# Patient Record
Sex: Female | Born: 1995
Health system: Southern US, Community
[De-identification: ages and names within clinical notes are randomized; demographics above are authoritative.]

## PROBLEM LIST (undated history)

## (undated) ENCOUNTER — Ambulatory Visit: Admission: EM | Payer: Medicaid Other | Source: Home / Self Care

## (undated) ENCOUNTER — Ambulatory Visit: Source: Home / Self Care

## (undated) DIAGNOSIS — E611 Iron deficiency: Secondary | ICD-10-CM

## (undated) DIAGNOSIS — L309 Dermatitis, unspecified: Secondary | ICD-10-CM

## (undated) HISTORY — DX: Dermatitis, unspecified: L30.9

---
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
## 2014-11-24 ENCOUNTER — Other Ambulatory Visit: Payer: Self-pay | Admitting: Family

## 2014-11-24 DIAGNOSIS — E049 Nontoxic goiter, unspecified: Secondary | ICD-10-CM

## 2014-12-01 ENCOUNTER — Other Ambulatory Visit: Payer: Self-pay

## 2015-11-29 ENCOUNTER — Other Ambulatory Visit: Payer: Self-pay

## 2015-11-29 ENCOUNTER — Other Ambulatory Visit (HOSPITAL_COMMUNITY)
Admission: RE | Admit: 2015-11-29 | Discharge: 2015-11-29 | Disposition: A | Discharge status: Home / Self Care | Payer: Self-pay | Source: Ambulatory Visit | Attending: Family | Admitting: Family

## 2015-11-29 ENCOUNTER — Other Ambulatory Visit (HOSPITAL_COMMUNITY): Admission: RE | Admit: 2015-11-29 | Payer: Self-pay | Source: Ambulatory Visit

## 2015-11-29 DIAGNOSIS — N760 Acute vaginitis: Secondary | ICD-10-CM | POA: Insufficient documentation

## 2015-11-29 DIAGNOSIS — Z1151 Encounter for screening for human papillomavirus (HPV): Secondary | ICD-10-CM | POA: Insufficient documentation

## 2015-11-29 DIAGNOSIS — Z01411 Encounter for gynecological examination (general) (routine) with abnormal findings: Secondary | ICD-10-CM | POA: Insufficient documentation

## 2015-11-30 LAB — CYTOLOGY - PAP

## 2015-12-29 ENCOUNTER — Ambulatory Visit (INDEPENDENT_AMBULATORY_CARE_PROVIDER_SITE_OTHER): Payer: BLUE CROSS/BLUE SHIELD | Admitting: Family Medicine

## 2015-12-29 VITALS — BP 104/62 | HR 106 | Temp 98.3°F | Resp 16 | Ht 64.0 in | Wt 137.4 lb

## 2015-12-29 DIAGNOSIS — N3001 Acute cystitis with hematuria: Secondary | ICD-10-CM | POA: Diagnosis not present

## 2015-12-29 DIAGNOSIS — R309 Painful micturition, unspecified: Secondary | ICD-10-CM | POA: Diagnosis not present

## 2015-12-29 LAB — POCT URINALYSIS DIP (MANUAL ENTRY)
Bilirubin, UA: NEGATIVE
Blood, UA: NEGATIVE
Glucose, UA: 100 — AB
Nitrite, UA: POSITIVE — AB
Protein Ur, POC: 30 — AB
Spec Grav, UA: <=1.005
Urobilinogen, UA: 1
pH, UA: 5

## 2015-12-29 LAB — POC MICROSCOPIC URINALYSIS (UMFC): Mucus: ABSENT

## 2015-12-29 MED ORDER — FLUCONAZOLE 150 MG PO TABS: 150.0000 mg | ORAL_TABLET | Freq: Once | ORAL | Status: DC

## 2015-12-29 MED ORDER — CEPHALEXIN 500 MG PO CAPS: 500.0000 mg | ORAL_CAPSULE | Freq: Three times a day (TID) | ORAL | Status: DC

## 2015-12-29 NOTE — Progress Notes (Signed)
Cassie ShadeLisa L Garrett is a 20 y.o. female who presents to Urgent Care today for urinary frequency urgency and dysuria. Symptoms present for the last 5 days. Symptoms are consistent with previous history of UTI. Patient also notes mild nausea. She notes mild pelvis pain and discomfort. She denies frank abdominal pain and vomiting or diarrhea. She has tried AZO which helps some. She feels well otherwise.   No past medical history on file. No past surgical history on file. Social History  Substance Use Topics  . Smoking status: Never Smoker   . Smokeless tobacco: Not on file  . Alcohol Use: Not on file   ROS as above Medications: Current Outpatient Prescriptions  Medication Sig Dispense Refill  . cephALEXin (KEFLEX) 500 MG capsule Take 1 capsule (500 mg total) by mouth 3 (three) times daily. 21 capsule 0  . fluconazole (DIFLUCAN) 150 MG tablet Take 1 tablet (150 mg total) by mouth once. For yeast infection 1 tablet 2   No current facility-administered medications for this visit.   No Known Allergies   Exam:  BP 104/62 mmHg  Pulse 106  Temp(Src) 98.3 F (36.8 C) (Oral)  Resp 16  Ht 5\' 4"  (1.626 m)  Wt 137 lb 6.4 oz (62.324 kg)  BMI 23.57 kg/m2  SpO2 95%  LMP 12/10/2015 Gen: Well NAD, Nontoxic appearing HEENT: EOMI,  MMM Lungs: Normal work of breathing. CTABL Heart: RRR no MRG Abd: NABS, Soft. Nondistended, mildly tender to palpation pelvis without rebound or guarding or masses palpated. No CVA angle tenderness to percussion Exts: Brisk capillary refill, warm and well perfused.   Results for orders placed or performed in visit on 12/29/15 (from the past 24 hour(s))  POCT urinalysis dipstick     Status: Abnormal   Collection Time: 12/29/15  6:35 PM  Result Value Ref Range   Color, UA orange (A) yellow   Clarity, UA cloudy (A) clear   Glucose, UA =100 (A) negative   Bilirubin, UA negative negative   Ketones, POC UA trace (5) (A) negative   Spec Grav, UA <=1.005    Blood, UA  negative negative   pH, UA 5.0    Protein Ur, POC =30 (A) negative   Urobilinogen, UA 1.0    Nitrite, UA Positive (A) Negative   Leukocytes, UA large (3+) (A) Negative   No results found.  Assessment and Plan: 20 y.o. female with Urinary tract infection. Culture pending. Empiric treatment with Keflex. Additionally use Diflucan for prevention of yeast infection. Return as needed.  Discussed warning signs or symptoms. Please see discharge instructions. Patient expresses understanding.

## 2015-12-29 NOTE — Patient Instructions (Addendum)
Thank you for coming in today. Take antibiotics.  Take fluconazole if you develop a yeast infection.  Return if not better.  If your belly pain worsens, or you have high fever, bad vomiting, blood in your stool or black tarry stool go to the Emergency Room.    Urinary Tract Infection Urinary tract infections (UTIs) can develop anywhere along your urinary tract. Your urinary tract is your body's drainage system for removing wastes and extra water. Your urinary tract includes two kidneys, two ureters, a bladder, and a urethra. Your kidneys are a pair of bean-shaped organs. Each kidney is about the size of your fist. They are located below your ribs, one on each side of your spine. CAUSES Infections are caused by microbes, which are microscopic organisms, including fungi, viruses, and bacteria. These organisms are so small that they can only be seen through a microscope. Bacteria are the microbes that most commonly cause UTIs. SYMPTOMS  Symptoms of UTIs may vary by age and gender of the patient and by the location of the infection. Symptoms in young women typically include a frequent and intense urge to urinate and a painful, burning feeling in the bladder or urethra during urination. Older women and men are more likely to be tired, shaky, and weak and have muscle aches and abdominal pain. A fever may mean the infection is in your kidneys. Other symptoms of a kidney infection include pain in your back or sides below the ribs, nausea, and vomiting. DIAGNOSIS To diagnose a UTI, your caregiver will ask you about your symptoms. Your caregiver will also ask you to provide a urine sample. The urine sample will be tested for bacteria and white blood cells. White blood cells are made by your body to help fight infection. TREATMENT  Typically, UTIs can be treated with medication. Because most UTIs are caused by a bacterial infection, they usually can be treated with the use of antibiotics. The choice of  antibiotic and length of treatment depend on your symptoms and the type of bacteria causing your infection. HOME CARE INSTRUCTIONS  If you were prescribed antibiotics, take them exactly as your caregiver instructs you. Finish the medication even if you feel better after you have only taken some of the medication.  Drink enough water and fluids to keep your urine clear or pale yellow.  Avoid caffeine, tea, and carbonated beverages. They tend to irritate your bladder.  Empty your bladder often. Avoid holding urine for long periods of time.  Empty your bladder before and after sexual intercourse.  After a bowel movement, women should cleanse from front to back. Use each tissue only once. SEEK MEDICAL CARE IF:   You have back pain.  You develop a fever.  Your symptoms do not begin to resolve within 3 days. SEEK IMMEDIATE MEDICAL CARE IF:   You have severe back pain or lower abdominal pain.  You develop chills.  You have nausea or vomiting.  You have continued burning or discomfort with urination. MAKE SURE YOU:   Understand these instructions.  Will watch your condition.  Will get help right away if you are not doing well or get worse.   This information is not intended to replace advice given to you by your health care provider. Make sure you discuss any questions you have with your health care provider.   Document Released: 06/05/2005 Document Revised: 05/17/2015 Document Reviewed: 10/04/2011 Elsevier Interactive Patient Education [employer]    IF you received an x-ray today,  you will receive an invoice from [facility]. Please contact [facility] at [phone] with questions or concerns regarding your invoice.   IF you received labwork today, you will receive an invoice from [facility]. Please contact [facility] at [phone] with questions or concerns regarding your invoice.   Our billing staff will not be able  to assist you with questions regarding bills from these companies.  You will be contacted with the lab results as soon as they are available. The fastest way to get your results is to activate your My Chart account. Instructions are located on the last page of this paperwork. If you have not heard from us regarding the results in 2 weeks, please contact this office.

## 2016-01-01 LAB — URINE CULTURE: Colony Count: >=100000

## 2016-01-02 NOTE — Progress Notes (Signed)
Quick Note:  Klebsiella is the bacteria present in the urine culture. It should be sensitive to the bacteria I prescribed. Let us know if not better. We will switch to a different antibiotic. ______

## 2016-01-12 ENCOUNTER — Encounter: Payer: Self-pay | Admitting: *Deleted

## 2016-01-22 ENCOUNTER — Other Ambulatory Visit: Payer: Self-pay | Admitting: Family Medicine

## 2016-01-23 DIAGNOSIS — D509 Iron deficiency anemia, unspecified: Secondary | ICD-10-CM | POA: Diagnosis not present

## 2016-01-23 DIAGNOSIS — R300 Dysuria: Secondary | ICD-10-CM | POA: Diagnosis not present

## 2016-01-23 DIAGNOSIS — R1030 Lower abdominal pain, unspecified: Secondary | ICD-10-CM | POA: Diagnosis not present

## 2016-03-29 DIAGNOSIS — R399 Unspecified symptoms and signs involving the genitourinary system: Secondary | ICD-10-CM | POA: Diagnosis not present

## 2016-03-29 DIAGNOSIS — N3000 Acute cystitis without hematuria: Secondary | ICD-10-CM | POA: Diagnosis not present

## 2016-04-30 DIAGNOSIS — Z6822 Body mass index (BMI) 22.0-22.9, adult: Secondary | ICD-10-CM | POA: Diagnosis not present

## 2016-04-30 DIAGNOSIS — N898 Other specified noninflammatory disorders of vagina: Secondary | ICD-10-CM | POA: Diagnosis not present

## 2016-04-30 DIAGNOSIS — Z124 Encounter for screening for malignant neoplasm of cervix: Secondary | ICD-10-CM | POA: Diagnosis not present

## 2016-04-30 DIAGNOSIS — Z113 Encounter for screening for infections with a predominantly sexual mode of transmission: Secondary | ICD-10-CM | POA: Diagnosis not present

## 2016-04-30 DIAGNOSIS — N920 Excessive and frequent menstruation with regular cycle: Secondary | ICD-10-CM | POA: Diagnosis not present

## 2016-04-30 DIAGNOSIS — Z01419 Encounter for gynecological examination (general) (routine) without abnormal findings: Secondary | ICD-10-CM | POA: Diagnosis not present

## 2016-04-30 DIAGNOSIS — D649 Anemia, unspecified: Secondary | ICD-10-CM | POA: Diagnosis not present

## 2016-06-24 DIAGNOSIS — R8299 Other abnormal findings in urine: Secondary | ICD-10-CM | POA: Diagnosis not present

## 2016-06-24 DIAGNOSIS — R300 Dysuria: Secondary | ICD-10-CM | POA: Diagnosis not present

## 2016-06-24 DIAGNOSIS — N898 Other specified noninflammatory disorders of vagina: Secondary | ICD-10-CM | POA: Diagnosis not present

## 2016-07-08 DIAGNOSIS — N83209 Unspecified ovarian cyst, unspecified side: Secondary | ICD-10-CM | POA: Diagnosis not present

## 2016-07-08 DIAGNOSIS — E559 Vitamin D deficiency, unspecified: Secondary | ICD-10-CM | POA: Diagnosis not present

## 2016-07-08 DIAGNOSIS — N926 Irregular menstruation, unspecified: Secondary | ICD-10-CM | POA: Diagnosis not present

## 2016-07-08 DIAGNOSIS — N920 Excessive and frequent menstruation with regular cycle: Secondary | ICD-10-CM | POA: Diagnosis not present

## 2016-07-17 ENCOUNTER — Ambulatory Visit (INDEPENDENT_AMBULATORY_CARE_PROVIDER_SITE_OTHER): Payer: BLUE CROSS/BLUE SHIELD | Admitting: Family Medicine

## 2016-07-17 VITALS — BP 126/72 | HR 88 | Temp 98.3°F | Resp 16 | Ht 64.5 in | Wt 149.8 lb

## 2016-07-17 DIAGNOSIS — N941 Unspecified dyspareunia: Secondary | ICD-10-CM | POA: Diagnosis not present

## 2016-07-17 DIAGNOSIS — N760 Acute vaginitis: Secondary | ICD-10-CM | POA: Diagnosis not present

## 2016-07-17 DIAGNOSIS — B9689 Other specified bacterial agents as the cause of diseases classified elsewhere: Secondary | ICD-10-CM | POA: Diagnosis not present

## 2016-07-17 DIAGNOSIS — N390 Urinary tract infection, site not specified: Secondary | ICD-10-CM | POA: Diagnosis not present

## 2016-07-17 LAB — POCT WET PREP WITH KOH
KOH Prep POC: NEGATIVE
Trichomonas, UA: NEGATIVE
Yeast Wet Prep HPF POC: NEGATIVE

## 2016-07-17 LAB — POCT URINALYSIS DIP (MANUAL ENTRY)
Bilirubin, UA: NEGATIVE
Glucose, UA: NEGATIVE
Ketones, POC UA: NEGATIVE
Nitrite, UA: POSITIVE — AB
Protein Ur, POC: 100 — AB
Spec Grav, UA: 1.025
Urobilinogen, UA: 0.2
pH, UA: 7

## 2016-07-17 LAB — POCT UA - MICROSCOPIC ONLY
Casts, Ur, LPF, POC: NEGATIVE
Crystals, Ur, HPF, POC: NEGATIVE
Mucus, UA: ABSENT
Yeast, UA: NEGATIVE

## 2016-07-17 MED ORDER — METRONIDAZOLE 500 MG PO TABS: 500.0000 mg | ORAL_TABLET | Freq: Two times a day (BID) | ORAL | 0 refills | Status: DC

## 2016-07-17 MED ORDER — FLUCONAZOLE 150 MG PO TABS: ORAL_TABLET | ORAL | 0 refills | Status: DC

## 2016-07-17 MED ORDER — CEPHALEXIN 500 MG PO CAPS: 500.0000 mg | ORAL_CAPSULE | Freq: Three times a day (TID) | ORAL | 0 refills | Status: DC

## 2016-07-17 NOTE — Addendum Note (Signed)
Addended by: Thelma BargeICHARDSON, Cambell Stanek D on: 07/17/2016 11:18 AM   Modules accepted: Orders

## 2016-07-17 NOTE — Patient Instructions (Addendum)
It appears that you have another UTI.  I will contact you with the results of your labs.  If you develop fevers, chills, abdominal pain, nausea, vomiting please seek immediate medical care.  Your antibiotics have been sent to your pharmacy.   Urinary Tract Infection Urinary tract infections (UTIs) can develop anywhere along your urinary tract. Your urinary tract is your body's drainage system for removing wastes and extra water. Your urinary tract includes two kidneys, two ureters, a bladder, and a urethra. Your kidneys are a pair of bean-shaped organs. Each kidney is about the size of your fist. They are located below your ribs, one on each side of your spine. CAUSES Infections are caused by microbes, which are microscopic organisms, including fungi, viruses, and bacteria. These organisms are so small that they can only be seen through a microscope. Bacteria are the microbes that most commonly cause UTIs. SYMPTOMS  Symptoms of UTIs may vary by age and gender of the patient and by the location of the infection. Symptoms in young women typically include a frequent and intense urge to urinate and a painful, burning feeling in the bladder or urethra during urination. Older women and men are more likely to be tired, shaky, and weak and have muscle aches and abdominal pain. A fever may mean the infection is in your kidneys. Other symptoms of a kidney infection include pain in your back or sides below the ribs, nausea, and vomiting. DIAGNOSIS To diagnose a UTI, your caregiver will ask you about your symptoms. Your caregiver will also ask you to provide a urine sample. The urine sample will be tested for bacteria and white blood cells. White blood cells are made by your body to help fight infection. TREATMENT  Typically, UTIs can be treated with medication. Because most UTIs are caused by a bacterial infection, they usually can be treated with the use of antibiotics. The choice of antibiotic and length of  treatment depend on your symptoms and the type of bacteria causing your infection. HOME CARE INSTRUCTIONS  If you were prescribed antibiotics, take them exactly as your caregiver instructs you. Finish the medication even if you feel better after you have only taken some of the medication.  Drink enough water and fluids to keep your urine clear or pale yellow.  Avoid caffeine, tea, and carbonated beverages. They tend to irritate your bladder.  Empty your bladder often. Avoid holding urine for long periods of time.  Empty your bladder before and after sexual intercourse.  After a bowel movement, women should cleanse from front to back. Use each tissue only once. SEEK MEDICAL CARE IF:   You have back pain.  You develop a fever.  Your symptoms do not begin to resolve within 3 days. SEEK IMMEDIATE MEDICAL CARE IF:   You have severe back pain or lower abdominal pain.  You develop chills.  You have nausea or vomiting.  You have continued burning or discomfort with urination. MAKE SURE YOU:   Understand these instructions.  Will watch your condition.  Will get help right away if you are not doing well or get worse.   This information is not intended to replace advice given to you by your health care provider. Make sure you discuss any questions you have with your health care provider.   Document Released: 06/05/2005 Document Revised: 05/17/2015 Document Reviewed: 10/04/2011 Elsevier Interactive Patient Education 2016 [employer]    IF you received an x-ray today, you will receive an invoice from [facility].  Please contact [facility] at [phone] with questions or concerns regarding your invoice.   IF you received labwork today, you will receive an invoice from [facility]. Please contact [facility] at [phone] with questions or concerns regarding your invoice.   Our billing staff will not be able to assist you with  questions regarding bills from these companies.  You will be contacted with the lab results as soon as they are available. The fastest way to get your results is to activate your My Chart account. Instructions are located on the last page of this paperwork. If you have not heard from us regarding the results in 2 weeks, please contact this office.

## 2016-07-17 NOTE — Progress Notes (Signed)
Subjective: WU:[id number]:dysuria NWG:NFAOHPI:Kathleena L Clabe Sealurnell is a 20 y.o. female presenting to clinic today for same day appointment. PCP: No primary care provider on file. Concerns today include:  Dysuria Patient reports that symptoms have been present for the last 2 weeks.  She notes that she started taking AZO last Tuesday, stopped Thursday or Friday.  She reports she has had recurrent UTIs, citing at least 4 in the past year.  She notes poor fluid intake.  No history of diabetes.  Not on any meds except OCPs.  Denies hematuria, nausea, vomiting, fevers, abdominal pain.  Endorses urinary urgency, frequency, dysuria, right sided back pain (that resolved 3 days ago).  Patient is sexually active.  Endorses dyspareunia.  Denies vaginal discharge, vaginal odors, post coital bleeding.  LMP 07/08/2016.  No history of STI.  Uses condoms with each intercourse.  No Known Allergies  Social History Reviewed: non smoker. FamHx and MedHx reviewed.  Please see EMR.  ROS: Per HPI  Objective: Office vital signs reviewed. BP 126/72 (BP Location: Right Arm, Patient Position: Sitting, Cuff Size: Normal)   Pulse 88   Temp 98.3 F (36.8 C) (Oral)   Resp 16   Ht 5' 4.5" (1.638 m)   Wt 149 lb 12.8 oz (67.9 kg)   SpO2 100%   BMI 25.32 kg/m   Physical Examination:  General: Awake, alert, well nourished, No acute distress Cardio: regular rate Pulm: normal WOB on room air GU: + suprapubic TTP. external vaginal tissue normal and without lesions, cervix posterior facing, no punctate lesions on cervix appreciated, moderate white discharge from cervical os, no bleeding, no cervical motion tenderness, no abdominal/ adnexal masses, no odor MSK: no CVA TTP bilaterally  Results for orders placed or performed in visit on 07/17/16 (from the past 24 hour(s))  POCT urinalysis dipstick     Status: Abnormal   Collection Time: 07/17/16 10:24 AM  Result Value Ref Range   Color, UA brown (A) yellow   Clarity, UA cloudy (A) clear   Glucose, UA negative negative   Bilirubin, UA negative negative   Ketones, POC UA negative negative   Spec Grav, UA 1.025    Blood, UA trace-intact (A) negative   pH, UA 7.0    Protein Ur, POC =100 (A) negative   Urobilinogen, UA 0.2    Nitrite, UA Positive (A) Negative   Leukocytes, UA Trace (A) Negative  POCT Wet Prep with KOH     Status: Abnormal   Collection Time: 07/17/16 10:25 AM  Result Value Ref Range   Trichomonas, UA Negative    Clue Cells Wet Prep HPF POC Few    Epithelial Wet Prep HPF POC Many Few, Moderate, Many, Too numerous to count   Yeast Wet Prep HPF POC negative    Bacteria Wet Prep HPF POC Many (A) None, Few, Too numerous to count   RBC Wet Prep HPF POC none    WBC Wet Prep HPF POC few    KOH Prep POC Negative   POCT UA - Microscopic Only     Status: None   Collection Time: 07/17/16 10:26 AM  Result Value Ref Range   WBC, Ur, HPF, POC too    RBC, urine, microscopic moderate    Bacteria, U Microscopic moderate    Mucus, UA absent    Epithelial cells, urine per micros few    Crystals, Ur, HPF, POC negative    Casts, Ur, LPF, POC negative    Yeast, UA negative  Assessment/ Plan: 20 y.o. female   1. Recurrent UTI, UCx 12/2015 w/ Klebsiella pneumoniae resistant to ampicillin.  UA today w/ 100 protein, +nitrites, trace luks, moderate bacteria, moderate RBCs - Urinalysis Dipstick - POCT UA - Microscopic Only - GC/Chlamydia probe amp ([place])not at [facility] - POCT Wet Prep with KOH - Urine culture - If continued recurrent UTI, consider prophylaxis vs referral to urology for further evaluation. - Patient asks that I call her with results. Ok to leave voicemail.  2. Dyspareunia in female.  No red flags.  No evidence of PID on exam.  Will evaluate for STI in setting of recurrent UTI.  Wet prep with few WBCs - GC/Chlamydia probe amp ([place])[facility] - POCT Wet Prep with KOH  3. Bacterial vaginosis.  Appreciated on wet prep w/ clue cells. - Flagyl  500mg  BID x7 days sent to pharmacy  Follow up prn.  Raliegh IpAshly M Kashauna Celmer, DO PGY-3, [facility]

## 2016-07-18 ENCOUNTER — Telehealth: Payer: Self-pay | Admitting: Family Medicine

## 2016-07-18 DIAGNOSIS — A749 Chlamydial infection, unspecified: Secondary | ICD-10-CM

## 2016-07-18 LAB — GC/CHLAMYDIA PROBE AMP
CT Probe RNA: DETECTED — AB
GC Probe RNA: NOT DETECTED

## 2016-07-18 LAB — URINE CULTURE: Organism ID, Bacteria: NO GROWTH

## 2016-07-18 MED ORDER — AZITHROMYCIN 500 MG PO TABS: ORAL_TABLET | ORAL | 0 refills | Status: DC

## 2016-07-18 NOTE — Telephone Encounter (Signed)
Have attempted to reach patient twice today re: positive Chlamydia test.  I have sent in Azithromycin 1gm to take PO x1.  Recommend that all be partners treated.  Abstain from intercourse for 10 days.  Will cc: providers at [facility] to continue to attempt to reach patient.   Keevon Henney M. Nadine CountsGottschalk, DO PGY-3, [facility]

## 2016-07-18 NOTE — Telephone Encounter (Signed)
Please ensure patient knows about positive Chlamydia test.

## 2016-07-19 NOTE — Telephone Encounter (Signed)
Spoke with Cassie Garrett and gave message per Dr. Gwendolyn GrantWalden  & Nadine CountsGottschalk.  Cassie Garrett verbalized understanding and will pick up meds from [facility] [address].   Understoon all partners need to be treated.

## 2016-08-25 ENCOUNTER — Other Ambulatory Visit: Payer: Self-pay | Admitting: Family Medicine

## 2016-08-30 DIAGNOSIS — Z113 Encounter for screening for infections with a predominantly sexual mode of transmission: Secondary | ICD-10-CM | POA: Diagnosis not present

## 2016-08-30 DIAGNOSIS — N3000 Acute cystitis without hematuria: Secondary | ICD-10-CM | POA: Diagnosis not present

## 2016-08-30 DIAGNOSIS — R399 Unspecified symptoms and signs involving the genitourinary system: Secondary | ICD-10-CM | POA: Diagnosis not present

## 2016-09-30 DIAGNOSIS — D500 Iron deficiency anemia secondary to blood loss (chronic): Secondary | ICD-10-CM | POA: Diagnosis not present

## 2016-09-30 DIAGNOSIS — N898 Other specified noninflammatory disorders of vagina: Secondary | ICD-10-CM | POA: Diagnosis not present

## 2017-01-24 DIAGNOSIS — R300 Dysuria: Secondary | ICD-10-CM | POA: Diagnosis not present

## 2017-02-04 ENCOUNTER — Other Ambulatory Visit (HOSPITAL_COMMUNITY)
Admission: RE | Admit: 2017-02-04 | Discharge: 2017-02-04 | Disposition: A | Discharge status: Home / Self Care | Payer: BLUE CROSS/BLUE SHIELD | Source: Ambulatory Visit | Attending: Family Medicine | Admitting: Family Medicine

## 2017-02-04 ENCOUNTER — Other Ambulatory Visit: Payer: Self-pay | Admitting: Family Medicine

## 2017-02-04 DIAGNOSIS — D509 Iron deficiency anemia, unspecified: Secondary | ICD-10-CM | POA: Diagnosis not present

## 2017-02-04 DIAGNOSIS — Z0000 Encounter for general adult medical examination without abnormal findings: Secondary | ICD-10-CM | POA: Diagnosis not present

## 2017-02-04 DIAGNOSIS — Z113 Encounter for screening for infections with a predominantly sexual mode of transmission: Secondary | ICD-10-CM | POA: Diagnosis not present

## 2017-02-11 LAB — CYTOLOGY - PAP
Diagnosis: UNDETERMINED — AB
HPV: NOT DETECTED

## 2017-12-25 ENCOUNTER — Encounter (HOSPITAL_COMMUNITY): Payer: Self-pay | Admitting: Emergency Medicine

## 2017-12-25 ENCOUNTER — Emergency Department (HOSPITAL_COMMUNITY)
Admission: EM | Admit: 2017-12-25 | Discharge: 2017-12-25 | Discharge status: Against Medical Advice | Payer: BLUE CROSS/BLUE SHIELD | Attending: Emergency Medicine | Admitting: Emergency Medicine

## 2017-12-25 ENCOUNTER — Emergency Department (HOSPITAL_COMMUNITY): Payer: BLUE CROSS/BLUE SHIELD

## 2017-12-25 DIAGNOSIS — Z041 Encounter for examination and observation following transport accident: Secondary | ICD-10-CM | POA: Diagnosis present

## 2017-12-25 DIAGNOSIS — S8992XA Unspecified injury of left lower leg, initial encounter: Secondary | ICD-10-CM | POA: Diagnosis not present

## 2017-12-25 DIAGNOSIS — M25562 Pain in left knee: Secondary | ICD-10-CM | POA: Diagnosis not present

## 2017-12-25 DIAGNOSIS — Z5321 Procedure and treatment not carried out due to patient leaving prior to being seen by health care provider: Secondary | ICD-10-CM | POA: Insufficient documentation
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
## 2017-12-28 ENCOUNTER — Encounter (HOSPITAL_COMMUNITY): Payer: Self-pay | Admitting: *Deleted

## 2017-12-28 ENCOUNTER — Emergency Department (HOSPITAL_COMMUNITY): Payer: BLUE CROSS/BLUE SHIELD

## 2017-12-28 ENCOUNTER — Emergency Department (HOSPITAL_COMMUNITY)
Admission: EM | Admit: 2017-12-28 | Discharge: 2017-12-28 | Disposition: A | Discharge status: Home / Self Care | Payer: BLUE CROSS/BLUE SHIELD | Attending: Emergency Medicine | Admitting: Emergency Medicine

## 2017-12-28 ENCOUNTER — Other Ambulatory Visit: Payer: Self-pay

## 2017-12-28 DIAGNOSIS — Y999 Unspecified external cause status: Secondary | ICD-10-CM | POA: Insufficient documentation

## 2017-12-28 DIAGNOSIS — M25562 Pain in left knee: Secondary | ICD-10-CM | POA: Insufficient documentation

## 2017-12-28 DIAGNOSIS — Y92410 Unspecified street and highway as the place of occurrence of the external cause: Secondary | ICD-10-CM | POA: Insufficient documentation

## 2017-12-28 DIAGNOSIS — Z79899 Other long term (current) drug therapy: Secondary | ICD-10-CM | POA: Insufficient documentation

## 2017-12-28 DIAGNOSIS — Y939 Activity, unspecified: Secondary | ICD-10-CM | POA: Diagnosis not present

## 2017-12-28 DIAGNOSIS — S8992XA Unspecified injury of left lower leg, initial encounter: Secondary | ICD-10-CM | POA: Diagnosis not present

## 2017-12-28 MED ORDER — ACETAMINOPHEN 325 MG PO TABS: 650.0000 mg | ORAL_TABLET | Freq: Four times a day (QID) | ORAL | 0 refills | Status: DC | PRN
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
## 2017-12-28 NOTE — Discharge Instructions (Addendum)

## 2019-02-03 ENCOUNTER — Telehealth: Payer: Self-pay | Admitting: Oncology

## 2019-02-03 NOTE — Telephone Encounter (Signed)
A new hem appt has been scheduled for the pt to see Dr. Clelia Croft on 5/29 at 11am. Pt aware to arrive 20 minutes early.

## 2019-02-05 ENCOUNTER — Other Ambulatory Visit: Payer: Self-pay

## 2019-02-05 ENCOUNTER — Inpatient Hospital Stay: Payer: 59 | Attending: Oncology | Admitting: Oncology

## 2019-02-05 VITALS — BP 92/77 | HR 111 | Temp 98.7°F | Resp 18 | Ht 64.0 in | Wt 168.0 lb

## 2019-02-05 DIAGNOSIS — N920 Excessive and frequent menstruation with regular cycle: Secondary | ICD-10-CM

## 2019-02-05 DIAGNOSIS — D500 Iron deficiency anemia secondary to blood loss (chronic): Secondary | ICD-10-CM

## 2019-02-05 DIAGNOSIS — D649 Anemia, unspecified: Secondary | ICD-10-CM

## 2019-02-05 NOTE — Progress Notes (Signed)
Reason for the request:    Anemia  HPI: I was asked by Dr. Langston MaskerMorris to evaluate Cassie Garrett for the evaluation of iron deficiency anemia.  She is a 23 year old woman [place] of [place] lived majority of her life.  He is a rather healthy woman without any significant comorbid conditions.  She had symptoms of fatigue and tiredness in February 2020 and found to have iron deficiency with iron level of 13 and increased iron-binding capacity 436.  Her hemoglobin at that time was 7.8 with white cell count of 5.3 and MCV of 66.  Her platelet count was elevated at 451.  She was started on iron supplements which she has been taking on a daily basis since that time.  She has taken Ferrlecit 90 on a daily basis with folic acid and repeat iron studies on Jan 29, 2019 showed persistent iron deficiency with iron level of 19, saturation of 4% and iron binding capacity of 505.  Her hemoglobin remained at 7.7 with MCV of 60 and platelet count of 467.  She is a minimally symptomatic at this time with mild fatigue but no chest pain, palpitation orthopnea.  She does report starch cravings but no other complaints.  She denies any hematochezia or melena but does report increase in her menstrual bleeding and occasional clotting.  She continues to be active and attends activities of daily living.  She does not report any headaches, blurry vision, syncope or seizures. Does not report any fevers, chills or sweats.  Does not report any cough, wheezing or hemoptysis.  Does not report any chest pain, palpitation, orthopnea or leg edema.  Does not report any nausea, vomiting or abdominal pain.  Does not report any constipation or diarrhea.  Does not report any skeletal complaints.    Does not report frequency, urgency or hematuria.  Does not report any skin rashes or lesions. Does not report any heat or cold intolerance.  Does not report any lymphadenopathy or petechiae.  Does not report any anxiety or depression.  Remaining  review of systems is negative.   She denies any history of diabetes, coronary disease or malignancy.  Current Outpatient Medications:  .  acetaminophen (TYLENOL) 325 MG tablet, Take 2 tablets (650 mg total) by mouth every 6 (six) hours as needed. Do not take more than 4000mg  of tylenol per day, Disp: 30 tablet, Rfl: 0 .  azithromycin (ZITHROMAX) 500 MG tablet, Take 2 tablets (1 gm) by mouth once., Disp: 2 tablet, Rfl: 0 .  cephALEXin (KEFLEX) 500 MG capsule, Take 1 capsule (500 mg total) by mouth 3 (three) times daily., Disp: 21 capsule, Rfl: 0 .  fluconazole (DIFLUCAN) 150 MG tablet, Take 1 tablet at onset of itching.  May repeat once in 3 days if persistent symptoms., Disp: 2 tablet, Rfl: 0 .  metroNIDAZOLE (FLAGYL) 500 MG tablet, Take 1 tablet (500 mg total) by mouth 2 (two) times daily., Disp: 14 tablet, Rfl: 0 .  Norethindrone-Ethinyl Estradiol-Fe Biphas (LO LOESTRIN FE) 1 MG-10 MCG / 10 MCG tablet, Take 1 tablet by mouth daily., Disp: , Rfl: :  No Known Allergies:  No family history of blood disorders.  Social History   Socioeconomic History  . Marital status: Single    Spouse name: Not on file  . Number of children: Not on file  . Years of education: Not on file  . Highest education level: Not on file  Occupational History  . Not on file  Social Needs  . Financial  resource strain: Not on file  . Food insecurity:    Worry: Not on file    Inability: Not on file  . Transportation needs:    Medical: Not on file    Non-medical: Not on file  Tobacco Use  . Smoking status: Never Smoker  . Smokeless tobacco: Never Used  Substance and Sexual Activity  . Alcohol use: No    Alcohol/week: 0.0 standard drinks  . Drug use: No  . Sexual activity: Yes    Birth control/protection: Pill  Lifestyle  . Physical activity:    Days per week: Not on file    Minutes per session: Not on file  . Stress: Not on file  Relationships  . Social connections:    Talks on phone: Not on file     Gets together: Not on file    Attends religious service: Not on file    Active member of club or organization: Not on file    Attends meetings of clubs or organizations: Not on file    Relationship status: Not on file  . Intimate partner violence:    Fear of current or ex partner: Not on file    Emotionally abused: Not on file    Physically abused: Not on file    Forced sexual activity: Not on file  Other Topics Concern  . Not on file  Social History Narrative  . Not on file  :  Pertinent items are noted in HPI.  Exam: Blood pressure 92/77, pulse (!) 111, temperature 98.7 F (37.1 C), temperature source Oral, resp. rate 18, height 5\' 4"  (1.626 m), weight 168 lb (76.2 kg), SpO2 99 %.  ECOG 0 General appearance: alert and cooperative appeared without distress. Head: atraumatic without any abnormalities. Eyes: conjunctivae/corneas clear. PERRL.  Sclera anicteric. Throat: lips, mucosa, and tongue normal; without oral thrush or ulcers. Resp: clear to auscultation bilaterally without rhonchi, wheezes or dullness to percussion. Cardio: regular rate and rhythm, S1, S2 normal, no murmur, click, rub or gallop GI: soft, non-tender; bowel sounds normal; no masses,  no organomegaly Skin: Skin color, texture, turgor normal. No rashes or lesions Lymph nodes: Cervical, supraclavicular, and axillary nodes normal. Neurologic: Grossly normal without any motor, sensory or deep tendon reflexes. Musculoskeletal: No joint deformity or effusion.   Assessment and Plan:    23 year old woman with:  1.  Iron deficiency anemia diagnosed in February 2020.  She presented with symptoms of anemia, hemoglobin of 7.8 and decreased iron stores.  After 2 months of oral iron therapy with daily dosing her hemoglobin remained at 7.7 and continues to have low iron levels.  The differential diagnosis of these findings and treatment options were reviewed today.  These findings suggest to either if she has poor oral  iron absorption or inadequate oral intake.  We have discussed strategies to improve her oral iron absorption which include taking iron 3 times a day for at least the next 3 months with vitamin C and avoid antiacids.  Alternatively, we discussed the role of intravenous iron as an option if her iron deficiency persists.  Complication associated with intravenous iron were reviewed which include arthralgias, myalgias and rarely anaphylaxis.  After discussion today, she prefers to continue with oral iron intake for a trial period of another 2 to 3 months with repeat laboratory testing at that time.  She continues to have issues with oral iron absorption, IV iron will be considered.  2.  Heavy menstrual cycles: She has follow-up with gynecology regarding  this issue.  3.  Follow-up: We will be in 3 months for repeat evaluation.  40  minutes was spent with the patient face-to-face today.  More than 50% of time was dedicated to discussing laboratory data, differential diagnosis, management options and complications related to therapy.    Thank you for the referral.  A copy of this consult has been forwarded to the requesting physician.

## 2019-02-08 ENCOUNTER — Telehealth: Payer: Self-pay | Admitting: Oncology

## 2019-02-08 NOTE — Telephone Encounter (Signed)
Tried to reach regarding schedule °
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
## 2019-04-13 ENCOUNTER — Encounter (HOSPITAL_COMMUNITY): Payer: Self-pay | Admitting: Emergency Medicine

## 2019-04-13 ENCOUNTER — Emergency Department (HOSPITAL_COMMUNITY)
Admission: EM | Admit: 2019-04-13 | Discharge: 2019-04-14 | Disposition: A | Discharge status: Home / Self Care | Payer: 59 | Attending: Emergency Medicine | Admitting: Emergency Medicine

## 2019-04-13 ENCOUNTER — Other Ambulatory Visit: Payer: Self-pay

## 2019-04-13 DIAGNOSIS — M5489 Other dorsalgia: Secondary | ICD-10-CM | POA: Diagnosis not present

## 2019-04-13 DIAGNOSIS — Z79899 Other long term (current) drug therapy: Secondary | ICD-10-CM | POA: Diagnosis not present

## 2019-04-13 DIAGNOSIS — R1010 Upper abdominal pain, unspecified: Secondary | ICD-10-CM | POA: Insufficient documentation

## 2019-04-13 MED ORDER — SODIUM CHLORIDE 0.9% FLUSH
3.0000 mL | Freq: Once | INTRAVENOUS | Status: AC
  Administered 2019-04-14: 3 mL via INTRAVENOUS

## 2019-04-13 MED ORDER — ALUM & MAG HYDROXIDE-SIMETH 200-200-20 MG/5ML PO SUSP
30.0000 mL | Freq: Once | ORAL | Status: AC
  Administered 2019-04-14: 30 mL via ORAL
  Filled 2019-04-13: qty 30

## 2019-04-13 MED ORDER — LIDOCAINE VISCOUS HCL 2 % MT SOLN
15.0000 mL | Freq: Once | OROMUCOSAL | Status: AC
  Administered 2019-04-14: 15 mL via ORAL
  Filled 2019-04-13: qty 15

## 2019-04-13 MED ORDER — ACETAMINOPHEN 500 MG PO TABS
1000.0000 mg | ORAL_TABLET | Freq: Once | ORAL | Status: AC
  Administered 2019-04-14: 1000 mg via ORAL
  Filled 2019-04-13: qty 2

## 2019-04-13 NOTE — ED Provider Notes (Signed)
TIME SEEN: 11:49 PM  CHIEF COMPLAINT: Upper abdominal pain, lower back pain  HPI: Patient is a 23 year old female with no significant past medical history who presents to the emergency department with upper abdominal pain that she describes as a tightness.  No aggravating or alleviating factors.  No fevers, chest pain, shortness of breath, cough, nausea, vomiting, diarrhea, dysuria, hematuria, vaginal bleeding or discharge.  No previous abdominal surgery.  Also complaining of lower back pain.  No injury to her back.  No numbness, tingling or focal weakness.  No bowel or bladder incontinence.  ROS: See HPI Constitutional: no fever  Eyes: no drainage  ENT: no runny nose   Cardiovascular:  no chest pain  Resp: no SOB  GI: no vomiting GU: no dysuria Integumentary: no rash  Allergy: no hives  Musculoskeletal: no leg swelling  Neurological: no slurred speech ROS otherwise negative  PAST MEDICAL HISTORY/PAST SURGICAL HISTORY:  History reviewed. No pertinent past medical history.  MEDICATIONS:  Prior to Admission medications   Medication Sig Start Date End Date Taking? Authorizing Provider  acetaminophen (TYLENOL) 325 MG tablet Take 2 tablets (650 mg total) by mouth every 6 (six) hours as needed. Do not take more than 4000mg  of tylenol per day 12/28/17   Couture, Cortni S, PA-C  azithromycin (ZITHROMAX) 500 MG tablet Take 2 tablets (1 gm) by mouth once. 07/18/16   Janora Norlander, DO  cephALEXin (KEFLEX) 500 MG capsule Take 1 capsule (500 mg total) by mouth 3 (three) times daily. 07/17/16   Janora Norlander, DO  fluconazole (DIFLUCAN) 150 MG tablet Take 1 tablet at onset of itching.  May repeat once in 3 days if persistent symptoms. 07/17/16   Janora Norlander, DO  metroNIDAZOLE (FLAGYL) 500 MG tablet Take 1 tablet (500 mg total) by mouth 2 (two) times daily. 07/17/16   Janora Norlander, DO  Norethindrone-Ethinyl Estradiol-Fe Biphas (LO LOESTRIN FE) 1 MG-10 MCG / 10 MCG tablet Take 1  tablet by mouth daily.    [provider]    ALLERGIES:  No Known Allergies  SOCIAL HISTORY:  Social History   Tobacco Use  . Smoking status: Never Smoker  . Smokeless tobacco: Never Used  Substance Use Topics  . Alcohol use: No    Alcohol/week: 0.0 standard drinks    FAMILY HISTORY: No family history on file.  EXAM: BP 125/78 (BP Location: Left Arm)   Pulse (!) 104   Temp 98.6 F (37 C) (Oral)   Resp 16   SpO2 100%  CONSTITUTIONAL: Alert and oriented and responds appropriately to questions. Well-appearing; well-nourished HEAD: Normocephalic EYES: Conjunctivae clear, pupils appear equal, EOMI ENT: normal nose; moist mucous membranes NECK: Supple, no meningismus, no nuchal rigidity, no LAD  CARD: RRR; S1 and S2 appreciated; no murmurs, no clicks, no rubs, no gallops RESP: Normal chest excursion without splinting or tachypnea; breath sounds clear and equal bilaterally; no wheezes, no rhonchi, no rales, no hypoxia or respiratory distress, speaking full sentences ABD/GI: Normal bowel sounds; non-distended; soft, minimally tender across the epigastric region, negative Murphy sign, no tenderness at McBurney's point, no rebound, no guarding, no peritoneal signs, no hepatosplenomegaly BACK:  The back appears normal and is non-tender to palpation, there is no CVA tenderness, no midline spinal tenderness or step-off or deformity EXT: Normal ROM in all joints; non-tender to palpation; no edema; normal capillary refill; no cyanosis, no calf tenderness or swelling    SKIN: Normal color for age and race; warm; no rash NEURO:  Moves all extremities equally, normal sensation diffusely, normal gait PSYCH: The patient's mood and manner are appropriate. Grooming and personal hygiene are appropriate.  MEDICAL DECISION MAKING: Patient with complaints of upper abdominal pain.  Could be related to indigestion.  Doubt ACS.  EKG shows no ischemic abnormality.  Will obtain abdominal labs and  urine.  Will give Tylenol for back pain.  This seems to be musculoskeletal in nature and there are no red flag symptoms to suggest fracture, cauda equina, spinal stenosis, epidural abscess or hematoma, discitis or osteomyelitis, transverse myelitis.  I do not feel she needs emergent imaging of her back.  Doubt kidney stone, pyelonephritis.  I do not think that the patient has cholecystitis.  ED PROGRESS: Patient reports symptoms improved after Tylenol, Bentyl, Protonix, GI cocktail.  Labs here are unremarkable.  Urine appears contaminated and she is not having urinary symptoms.  I do not feel this needs to be repeated today.  I feel she is safe to be discharged home and follow-up with her doctor as an outpatient.  Will discharge with prescription of Protonix.  Recommended diet changes for indigestion, GERD.  At this time, I do not feel there is any life-threatening condition present. I have reviewed and discussed all results (EKG, imaging, lab, urine as appropriate) and exam findings with patient/family. I have reviewed nursing notes and appropriate previous records.  I feel the patient is safe to be discharged home without further emergent workup and can continue workup as an outpatient as needed. Discussed usual and customary return precautions. Patient/family verbalize understanding and are comfortable with this plan.  Outpatient follow-up has been provided as needed. All questions have been answered.     EKG Interpretation  Date/Time:  Wednesday April 14 2019 00:33:44 EDT Ventricular Rate:  91 PR Interval:    QRS Duration: 79 QT Interval:  366 QTC Calculation: 451 R Axis:   80 Text Interpretation:  Sinus rhythm RSR' in V1 or V2, probably normal variant No old tracing to compare Confirmed by , Baxter HireKristen [id number]) on 04/14/2019 12:36:16 AM          , Layla MawKristen N, DO 04/14/19 [id number]

## 2019-04-13 NOTE — ED Triage Notes (Signed)
Patient here from home with complaints of upper abd pain and back pain. Describes pain as a "tight" feeling x2 days. Denis n/v.

## 2019-04-14 LAB — I-STAT BETA HCG BLOOD, ED (MC, WL, AP ONLY): I-stat hCG, quantitative: <5 m[IU]/mL (ref <5)

## 2019-04-14 LAB — COMPREHENSIVE METABOLIC PANEL
ALT: 20 U/L (ref 0–44)
AST: 30 U/L (ref 15–41)
Albumin: 4.2 g/dL (ref 3.5–5.0)
Alkaline Phosphatase: 49 U/L (ref 38–126)
Anion gap: 9 (ref 5–15)
BUN: 8 mg/dL (ref 6–20)
CO2: 27 mmol/L (ref 22–32)
Calcium: 9.5 mg/dL (ref 8.9–10.3)
Chloride: 103 mmol/L (ref 98–111)
Creatinine, Ser: 0.55 mg/dL (ref 0.44–1.00)
GFR calc Af Amer: >60 mL/min (ref >60)
GFR calc non Af Amer: >60 mL/min (ref >60)
Glucose, Bld: 94 mg/dL (ref 70–99)
Potassium: 2.9 mmol/L — ABNORMAL LOW (ref 3.5–5.1)
Sodium: 139 mmol/L (ref 135–145)
Total Bilirubin: 0.3 mg/dL (ref 0.3–1.2)
Total Protein: 8.3 g/dL — ABNORMAL HIGH (ref 6.5–8.1)

## 2019-04-14 LAB — CBC
HCT: 29.6 % — ABNORMAL LOW (ref 36.0–46.0)
Hemoglobin: 7.5 g/dL — ABNORMAL LOW (ref 12.0–15.0)
MCH: 15.9 pg — ABNORMAL LOW (ref 26.0–34.0)
MCHC: 25.3 g/dL — ABNORMAL LOW (ref 30.0–36.0)
MCV: 62.6 fL — ABNORMAL LOW (ref 80.0–100.0)
Platelets: 616 10*3/uL — ABNORMAL HIGH (ref 150–400)
RBC: 4.73 MIL/uL (ref 3.87–5.11)
RDW: 19.5 % — ABNORMAL HIGH (ref 11.5–15.5)
WBC: 6.2 10*3/uL (ref 4.0–10.5)
nRBC: 0 % (ref 0.0–0.2)

## 2019-04-14 LAB — URINALYSIS, ROUTINE W REFLEX MICROSCOPIC
Bacteria, UA: NONE SEEN
Bilirubin Urine: NEGATIVE
Glucose, UA: NEGATIVE mg/dL
Hgb urine dipstick: NEGATIVE
Ketones, ur: NEGATIVE mg/dL
Nitrite: NEGATIVE
Protein, ur: 30 mg/dL — AB
Specific Gravity, Urine: 1.027 (ref 1.005–1.030)
pH: 5 (ref 5.0–8.0)

## 2019-04-14 LAB — LIPASE, BLOOD: Lipase: 21 U/L (ref 11–51)

## 2019-04-14 MED ORDER — PANTOPRAZOLE SODIUM 40 MG PO TBEC: 40.0000 mg | DELAYED_RELEASE_TABLET | Freq: Every day | ORAL | 1 refills | Status: DC

## 2019-04-14 MED ORDER — PANTOPRAZOLE SODIUM 40 MG PO TBEC
40.0000 mg | DELAYED_RELEASE_TABLET | Freq: Once | ORAL | Status: AC
  Administered 2019-04-14: 40 mg via ORAL
  Filled 2019-04-14: qty 1

## 2019-04-14 MED ORDER — DICYCLOMINE HCL 10 MG PO CAPS
20.0000 mg | ORAL_CAPSULE | Freq: Once | ORAL | Status: AC
  Administered 2019-04-14: 20 mg via ORAL
  Filled 2019-04-14: qty 2

## 2019-04-14 NOTE — Discharge Instructions (Signed)
Please avoid NSAIDs such as aspirin (Goody powders), ibuprofen (Motrin, Advil), naproxen (Aleve) as these may worsen your symptoms.  Tylenol 1000 mg every 6 hours is safe to take as long as you have no history of liver problems (heavy alcohol use, cirrhosis, hepatitis).  Please avoid spicy, acidic (citrus fruits, tomato based sauces, salsa), greasy, fatty foods.  Please avoid caffeine and alcohol.    You may use Maalox or Mylanta, Pepcid, Tums over-the-counter as needed for abdominal discomfort.

## 2019-04-20 ENCOUNTER — Inpatient Hospital Stay: Payer: 59 | Attending: Oncology | Admitting: Oncology

## 2019-04-20 ENCOUNTER — Inpatient Hospital Stay: Payer: 59

## 2019-04-21 ENCOUNTER — Telehealth: Payer: Self-pay | Admitting: Oncology

## 2019-04-21 NOTE — Telephone Encounter (Signed)
No answer - called per 8/12 sch message - left message for pt to call back for appt

## 2019-05-14 ENCOUNTER — Inpatient Hospital Stay: Payer: 59 | Attending: Oncology

## 2019-05-14 ENCOUNTER — Inpatient Hospital Stay (HOSPITAL_BASED_OUTPATIENT_CLINIC_OR_DEPARTMENT_OTHER): Payer: 59 | Admitting: Oncology

## 2019-05-14 ENCOUNTER — Other Ambulatory Visit: Payer: Self-pay

## 2019-05-14 VITALS — BP 121/70 | HR 109 | Temp 97.8°F | Resp 18 | Ht 64.0 in | Wt 158.1 lb

## 2019-05-14 DIAGNOSIS — R5383 Other fatigue: Secondary | ICD-10-CM | POA: Insufficient documentation

## 2019-05-14 DIAGNOSIS — G629 Polyneuropathy, unspecified: Secondary | ICD-10-CM | POA: Insufficient documentation

## 2019-05-14 DIAGNOSIS — R1013 Epigastric pain: Secondary | ICD-10-CM | POA: Insufficient documentation

## 2019-05-14 DIAGNOSIS — K5900 Constipation, unspecified: Secondary | ICD-10-CM | POA: Diagnosis not present

## 2019-05-14 DIAGNOSIS — D649 Anemia, unspecified: Secondary | ICD-10-CM

## 2019-05-14 DIAGNOSIS — Z79899 Other long term (current) drug therapy: Secondary | ICD-10-CM | POA: Insufficient documentation

## 2019-05-14 DIAGNOSIS — D509 Iron deficiency anemia, unspecified: Secondary | ICD-10-CM | POA: Diagnosis not present

## 2019-05-14 LAB — CBC WITH DIFFERENTIAL (CANCER CENTER ONLY)
Abs Immature Granulocytes: 0 10*3/uL (ref 0.00–0.07)
Basophils Absolute: 0 10*3/uL (ref 0.0–0.1)
Basophils Relative: 1 %
Eosinophils Absolute: 0 10*3/uL (ref 0.0–0.5)
Eosinophils Relative: 1 %
HCT: 31 % — ABNORMAL LOW (ref 36.0–46.0)
Hemoglobin: 8.4 g/dL — ABNORMAL LOW (ref 12.0–15.0)
Immature Granulocytes: 0 %
Lymphocytes Relative: 48 %
Lymphs Abs: 2 10*3/uL (ref 0.7–4.0)
MCH: 16.9 pg — ABNORMAL LOW (ref 26.0–34.0)
MCHC: 27.1 g/dL — ABNORMAL LOW (ref 30.0–36.0)
MCV: 62.4 fL — ABNORMAL LOW (ref 80.0–100.0)
Monocytes Absolute: 0.4 10*3/uL (ref 0.1–1.0)
Monocytes Relative: 10 %
Neutro Abs: 1.7 10*3/uL (ref 1.7–7.7)
Neutrophils Relative %: 40 %
Platelet Count: 530 10*3/uL — ABNORMAL HIGH (ref 150–400)
RBC: 4.97 MIL/uL (ref 3.87–5.11)
RDW: 20.4 % — ABNORMAL HIGH (ref 11.5–15.5)
WBC Count: 4.2 10*3/uL (ref 4.0–10.5)
nRBC: 0 % (ref 0.0–0.2)

## 2019-05-14 NOTE — Progress Notes (Signed)
Hematology and Oncology Follow Up Visit  Cassie Garrett [id number] October 07, 1995 23 y.o. 05/14/2019 3:28 PM Patient, No Pcp PerMorris, Megan, DO   Principle Diagnosis: 23 year old woman with iron deficiency anemia diagnosed in February 2020.  She presented with hemoglobin of 7.8 with iron of 13.    Current therapy: Oral iron therapy.  She is taking iron twice a day.  Interim History: Cassie Garrett returns today for a follow-up.  Since the last visit, she continues to take oral iron replacement although inconsistently and twice a day at the most.  She has reported issues with dyspepsia and constipation associated with it.  He does report some fatigue but no shortness of breath or dyspnea on exertion.  She has not reported lower extremity neuropathy previously and now she has both lower and upper extremity neuropathy.  She denies any hematochezia or melena.  Regular menstrual cycles noted.  Patient denied any alteration mental status, neuropathy, confusion or dizziness.  Denies any headaches or lethargy.  Denies any night sweats, weight loss or changes in appetite.  Denied orthopnea, dyspnea on exertion or chest discomfort.  Denies shortness of breath, difficulty breathing hemoptysis or cough.  Denies any abdominal distention, nausea, early satiety or dyspepsia.  Denies any hematuria, frequency, dysuria or nocturia.  Denies any skin irritation, dryness or rash.  Denies any ecchymosis or petechiae.  Denies any lymphadenopathy or clotting.  Denies any heat or cold intolerance.  Denies any anxiety or depression.  Remaining review of system is negative.        Medications: I have reviewed the patient's current medications.  Current Outpatient Medications  Medication Sig Dispense Refill  . acetaminophen (TYLENOL) 325 MG tablet Take 2 tablets (650 mg total) by mouth every 6 (six) hours as needed. Do not take more than 4000mg  of tylenol per day (Patient not taking: Reported on 04/14/2019) 30 tablet 0  .  azithromycin (ZITHROMAX) 500 MG tablet Take 2 tablets (1 gm) by mouth once. (Patient not taking: Reported on 04/14/2019) 2 tablet 0  . cephALEXin (KEFLEX) 500 MG capsule Take 1 capsule (500 mg total) by mouth 3 (three) times daily. (Patient not taking: Reported on 04/14/2019) 21 capsule 0  . fluconazole (DIFLUCAN) 150 MG tablet Take 1 tablet at onset of itching.  May repeat once in 3 days if persistent symptoms. (Patient not taking: Reported on 04/14/2019) 2 tablet 0  . metroNIDAZOLE (FLAGYL) 500 MG tablet Take 1 tablet (500 mg total) by mouth 2 (two) times daily. (Patient not taking: Reported on 04/14/2019) 14 tablet 0  . pantoprazole (PROTONIX) 40 MG tablet Take 1 tablet (40 mg total) by mouth daily. 30 tablet 1   No current facility-administered medications for this visit.      Allergies: No Known Allergies  Past Medical History, Surgical history, Social history, and Family History were reviewed and updated.   Physical Exam: Blood pressure 121/70, pulse (!) 109, temperature 97.8 F (36.6 C), temperature source Temporal, resp. rate 18, height 5\' 4"  (1.626 m), weight 158 lb 1.6 oz (71.7 kg), SpO2 100 %.   ECOG: 0   General appearance: Comfortable appearing without any discomfort Head: Normocephalic without any trauma Oropharynx: Mucous membranes are moist and pink without any thrush or ulcers. Eyes: Pupils are equal and round reactive to light. Lymph nodes: No cervical, supraclavicular, inguinal or axillary lymphadenopathy.   Heart:regular rate and rhythm.  S1 and S2 without leg edema. Lung: Clear without any rhonchi or wheezes.  No dullness to percussion. Abdomin: Soft, nontender,  nondistended with good bowel sounds.  No hepatosplenomegaly. Musculoskeletal: No joint deformity or effusion.  Full range of motion noted. Neurological: No deficits noted on motor, sensory and deep tendon reflex exam. Skin: No petechial rash or dryness.  Appeared moist.     Lab Results: Lab Results   Component Value Date   WBC 6.2 04/14/2019   HGB 7.5 (L) 04/14/2019   HCT 29.6 (L) 04/14/2019   MCV 62.6 (L) 04/14/2019   PLT 616 (H) 04/14/2019     Chemistry      Component Value Date/Time   NA 139 04/14/2019 0004   K 2.9 (L) 04/14/2019 0004   CL 103 04/14/2019 0004   CO2 27 04/14/2019 0004   BUN 8 04/14/2019 0004   CREATININE 0.55 04/14/2019 0004      Component Value Date/Time   CALCIUM 9.5 04/14/2019 0004   ALKPHOS 49 04/14/2019 0004   AST 30 04/14/2019 0004   ALT 20 04/14/2019 0004   BILITOT 0.3 04/14/2019 0004       Impression and Plan:  23 year old woman with:  1.  Anemia related to Iron deficiency due to poor oral iron absorption on chronic menstrual losses.  She is currently taken oral iron therapy that has been ineffective.  Her hemoglobin today continues to be low with microcytosis and elevated RDW.  Risks and benefits of switching to intravenous iron was discussed today.  Potential complications including arthralgias, myalgias and infusion related complications were reviewed.  Rarely anaphylaxis has been also noted.  She is agreeable to proceed at this time.  2.    Neuropathy: Unclear etiology but could be related to her iron deficiency.  Replace iron and monitor after that.  3.  Follow-up: In 3 months for repeat evaluation.  15  minutes was spent with the patient face-to-face today.  More than 50% of time was spent on reviewing disease status, treatment options and answering questions regarding future plan of care.    Cassie HoseFiras Kaile Bixler, MD 9/4/20203:28 PM

## 2019-05-18 LAB — IRON AND TIBC
Iron: 12 ug/dL — ABNORMAL LOW (ref 41–142)
Saturation Ratios: 2 % — ABNORMAL LOW (ref 21–57)
TIBC: 491 ug/dL — ABNORMAL HIGH (ref 236–444)
UIBC: 480 ug/dL — ABNORMAL HIGH (ref 120–384)

## 2019-05-18 LAB — FERRITIN: Ferritin: <4 ng/mL — ABNORMAL LOW (ref 11–307)

## 2019-05-19 ENCOUNTER — Other Ambulatory Visit: Payer: Self-pay | Admitting: Oncology

## 2019-05-20 ENCOUNTER — Telehealth: Payer: Self-pay | Admitting: Oncology

## 2019-05-20 NOTE — Telephone Encounter (Signed)
Scheduled appt per 9/9 sch message - pt aware of appt date and time   

## 2019-05-21 ENCOUNTER — Inpatient Hospital Stay: Payer: 59

## 2019-05-21 ENCOUNTER — Other Ambulatory Visit: Payer: Self-pay

## 2019-05-21 VITALS — BP 105/70 | HR 83 | Temp 98.6°F | Resp 18

## 2019-05-21 DIAGNOSIS — D649 Anemia, unspecified: Secondary | ICD-10-CM

## 2019-05-21 DIAGNOSIS — D509 Iron deficiency anemia, unspecified: Secondary | ICD-10-CM | POA: Diagnosis not present

## 2019-05-21 MED ORDER — DIPHENHYDRAMINE HCL 50 MG/ML IJ SOLN
INTRAMUSCULAR | Status: AC
  Filled 2019-05-21: qty 1

## 2019-05-21 MED ORDER — SODIUM CHLORIDE 0.9 % IV SOLN
Freq: Once | INTRAVENOUS | Status: AC
  Administered 2019-05-21: 09:00:00 via INTRAVENOUS
  Filled 2019-05-21: qty 250

## 2019-05-21 MED ORDER — ACETAMINOPHEN 325 MG PO TABS
ORAL_TABLET | ORAL | Status: AC
  Filled 2019-05-21: qty 2

## 2019-05-21 MED ORDER — ACETAMINOPHEN 325 MG PO TABS
650.0000 mg | ORAL_TABLET | Freq: Once | ORAL | Status: AC
  Administered 2019-05-21: 650 mg via ORAL

## 2019-05-21 MED ORDER — DIPHENHYDRAMINE HCL 50 MG/ML IJ SOLN
50.0000 mg | Freq: Once | INTRAMUSCULAR | Status: AC
  Administered 2019-05-21: 50 mg via INTRAVENOUS

## 2019-05-21 MED ORDER — SODIUM CHLORIDE 0.9 % IV SOLN
200.0000 mg | Freq: Once | INTRAVENOUS | Status: AC
  Administered 2019-05-21: 200 mg via INTRAVENOUS
  Filled 2019-05-21: qty 10

## 2019-05-21 NOTE — Patient Instructions (Signed)

## 2019-05-28 ENCOUNTER — Inpatient Hospital Stay: Payer: 59

## 2019-05-28 ENCOUNTER — Other Ambulatory Visit: Payer: Self-pay

## 2019-05-28 VITALS — BP 108/64 | HR 84 | Temp 98.0°F | Resp 16

## 2019-05-28 DIAGNOSIS — D509 Iron deficiency anemia, unspecified: Secondary | ICD-10-CM | POA: Diagnosis not present

## 2019-05-28 DIAGNOSIS — D649 Anemia, unspecified: Secondary | ICD-10-CM

## 2019-05-28 MED ORDER — ACETAMINOPHEN 325 MG PO TABS
650.0000 mg | ORAL_TABLET | Freq: Once | ORAL | Status: AC
  Administered 2019-05-28: 650 mg via ORAL

## 2019-05-28 MED ORDER — SODIUM CHLORIDE 0.9 % IV SOLN
200.0000 mg | Freq: Once | INTRAVENOUS | Status: AC
  Administered 2019-05-28: 200 mg via INTRAVENOUS
  Filled 2019-05-28: qty 10

## 2019-05-28 MED ORDER — ACETAMINOPHEN 325 MG PO TABS
ORAL_TABLET | ORAL | Status: AC
  Filled 2019-05-28: qty 2

## 2019-05-28 MED ORDER — DIPHENHYDRAMINE HCL 25 MG PO CAPS
ORAL_CAPSULE | ORAL | Status: AC
  Filled 2019-05-28: qty 2

## 2019-05-28 MED ORDER — DIPHENHYDRAMINE HCL 50 MG/ML IJ SOLN
INTRAMUSCULAR | Status: AC
  Filled 2019-05-28: qty 1

## 2019-05-28 MED ORDER — DIPHENHYDRAMINE HCL 50 MG/ML IJ SOLN
50.0000 mg | Freq: Once | INTRAMUSCULAR | Status: AC
  Administered 2019-05-28: 50 mg via INTRAVENOUS

## 2019-05-28 MED ORDER — SODIUM CHLORIDE 0.9 % IV SOLN
Freq: Once | INTRAVENOUS | Status: AC
  Administered 2019-05-28: 09:00:00 via INTRAVENOUS
  Filled 2019-05-28: qty 250

## 2019-05-28 NOTE — Patient Instructions (Signed)

## 2019-06-04 ENCOUNTER — Telehealth: Payer: Self-pay | Admitting: Oncology

## 2019-06-04 ENCOUNTER — Inpatient Hospital Stay: Payer: 59

## 2019-06-04 NOTE — Telephone Encounter (Signed)
Returned patient's phone call regarding rescheduling missed 09/25 appointment, per patient's request appointment has been moved to 10/09.   Message to provider.

## 2019-06-11 ENCOUNTER — Telehealth: Payer: Self-pay

## 2019-06-11 ENCOUNTER — Inpatient Hospital Stay: Payer: 59

## 2019-06-11 NOTE — Telephone Encounter (Signed)
Called patient to check and see if she was aware she missed an infusion appointment this morning. Patient stated she overslept and would like to reschedule. Scheduling message sent.

## 2019-06-14 ENCOUNTER — Telehealth: Payer: Self-pay | Admitting: Oncology

## 2019-06-14 NOTE — Telephone Encounter (Signed)
Scheduled appt per 10/2 sch message - unable to reach pt . Left message with appt date and time   

## 2019-06-18 ENCOUNTER — Other Ambulatory Visit: Payer: Self-pay

## 2019-06-18 ENCOUNTER — Inpatient Hospital Stay: Payer: 59 | Attending: Oncology

## 2019-06-18 VITALS — BP 113/77 | HR 79 | Temp 97.9°F | Resp 16 | Wt 151.8 lb

## 2019-06-18 DIAGNOSIS — Z79899 Other long term (current) drug therapy: Secondary | ICD-10-CM | POA: Diagnosis not present

## 2019-06-18 DIAGNOSIS — D509 Iron deficiency anemia, unspecified: Secondary | ICD-10-CM | POA: Diagnosis not present

## 2019-06-18 DIAGNOSIS — D649 Anemia, unspecified: Secondary | ICD-10-CM

## 2019-06-18 MED ORDER — SODIUM CHLORIDE 0.9 % IV SOLN: 200.0000 mg | Freq: Once | INTRAVENOUS | Status: DC

## 2019-06-18 MED ORDER — IRON SUCROSE 20 MG/ML IV SOLN
200.0000 mg | Freq: Once | INTRAVENOUS | Status: DC
  Filled 2019-06-18: qty 10

## 2019-06-18 MED ORDER — ACETAMINOPHEN 325 MG PO TABS
ORAL_TABLET | ORAL | Status: AC
  Filled 2019-06-18: qty 2

## 2019-06-18 MED ORDER — SODIUM CHLORIDE 0.9 % IV SOLN
200.0000 mg | Freq: Once | INTRAVENOUS | Status: AC
  Administered 2019-06-18: 200 mg via INTRAVENOUS
  Filled 2019-06-18: qty 10

## 2019-06-18 MED ORDER — ACETAMINOPHEN 325 MG PO TABS
650.0000 mg | ORAL_TABLET | Freq: Once | ORAL | Status: AC
  Administered 2019-06-18: 650 mg via ORAL

## 2019-06-18 MED ORDER — DIPHENHYDRAMINE HCL 50 MG/ML IJ SOLN
50.0000 mg | Freq: Once | INTRAMUSCULAR | Status: AC
  Administered 2019-06-18: 50 mg via INTRAVENOUS

## 2019-06-18 MED ORDER — SODIUM CHLORIDE 0.9 % IV SOLN
Freq: Once | INTRAVENOUS | Status: AC
  Administered 2019-06-18: 15:00:00 via INTRAVENOUS
  Filled 2019-06-18: qty 250

## 2019-06-18 MED ORDER — DIPHENHYDRAMINE HCL 50 MG/ML IJ SOLN
INTRAMUSCULAR | Status: AC
  Filled 2019-06-18: qty 1

## 2019-06-18 NOTE — Patient Instructions (Signed)

## 2019-06-25 ENCOUNTER — Inpatient Hospital Stay: Payer: 59

## 2019-07-15 ENCOUNTER — Telehealth: Payer: Self-pay | Admitting: Oncology

## 2019-07-15 NOTE — Telephone Encounter (Signed)
Returned patient's phone call regarding rescheduling missed Iron appointment on 10/16, per patient's request appointment has been rescheduled to 11/09.   Message to provider.

## 2019-07-15 NOTE — Telephone Encounter (Signed)
Ok to reschedule

## 2019-07-19 ENCOUNTER — Inpatient Hospital Stay: Payer: 59 | Attending: Oncology

## 2019-09-14 ENCOUNTER — Inpatient Hospital Stay: Payer: 59

## 2019-09-14 ENCOUNTER — Inpatient Hospital Stay: Payer: 59 | Attending: Oncology | Admitting: Oncology

## 2019-12-06 ENCOUNTER — Ambulatory Visit: Payer: 59 | Attending: Internal Medicine

## 2019-12-06 DIAGNOSIS — Z20822 Contact with and (suspected) exposure to covid-19: Secondary | ICD-10-CM

## 2019-12-07 LAB — SARS-COV-2, NAA 2 DAY TAT

## 2019-12-07 LAB — NOVEL CORONAVIRUS, NAA: SARS-CoV-2, NAA: NOT DETECTED

## 2019-12-27 ENCOUNTER — Ambulatory Visit: Payer: 59 | Attending: Internal Medicine

## 2019-12-27 DIAGNOSIS — Z20822 Contact with and (suspected) exposure to covid-19: Secondary | ICD-10-CM

## 2019-12-28 LAB — NOVEL CORONAVIRUS, NAA: SARS-CoV-2, NAA: NOT DETECTED

## 2019-12-28 LAB — SARS-COV-2, NAA 2 DAY TAT

## 2020-03-13 ENCOUNTER — Ambulatory Visit: Payer: 59 | Admitting: Dermatology

## 2020-04-20 ENCOUNTER — Other Ambulatory Visit: Payer: Self-pay

## 2020-04-20 ENCOUNTER — Encounter: Payer: Self-pay | Admitting: Physician Assistant

## 2020-04-20 ENCOUNTER — Ambulatory Visit
Admission: EM | Admit: 2020-04-20 | Discharge: 2020-04-20 | Disposition: A | Discharge status: Home / Self Care | Payer: BC Managed Care – PPO | Attending: Physician Assistant | Admitting: Physician Assistant

## 2020-04-20 DIAGNOSIS — R05 Cough: Secondary | ICD-10-CM

## 2020-04-20 DIAGNOSIS — Z20822 Contact with and (suspected) exposure to covid-19: Secondary | ICD-10-CM | POA: Diagnosis not present

## 2020-04-20 DIAGNOSIS — J029 Acute pharyngitis, unspecified: Secondary | ICD-10-CM

## 2020-04-20 DIAGNOSIS — R059 Cough, unspecified: Secondary | ICD-10-CM

## 2020-04-20 DIAGNOSIS — R52 Pain, unspecified: Secondary | ICD-10-CM

## 2020-04-20 MED ORDER — LIDOCAINE VISCOUS HCL 2 % MT SOLN: OROMUCOSAL | 0 refills | Status: DC

## 2020-04-20 NOTE — ED Triage Notes (Signed)
Pt c/o sore throat, body aches, cough, and chills x2 days with a positive covid exposure.

## 2020-04-20 NOTE — ED Provider Notes (Signed)
[facility]    CSN: [id number] Arrival date & time: 04/20/20  1753      History   Chief Complaint No chief complaint on file.   HPI Cassie Garrett is a 24 y.o. female.   24 year old female comes in for 2 day of URI symptoms. Cough, sore throat, chills, body aches. No known fever. Some low abdominal pain without nausea/vomiting. Still able to tolerate oral intake. Denies shortness of breath, loss of taste/smell. Positive COVID contact.      History reviewed. No pertinent past medical history.  Patient Active Problem List   Diagnosis Date Noted  . Anemia 05/14/2019    History reviewed. No pertinent surgical history.  OB History   No obstetric history on file.      Home Medications    Prior to Admission medications   Medication Sig Start Date End Date Taking? Authorizing Provider  lidocaine (XYLOCAINE) 2 % solution 5-15 mL gurgle as needed 04/20/20   Cathie Hoops, Susie Ehresman V, PA-C  pantoprazole (PROTONIX) 40 MG tablet Take 1 tablet (40 mg total) by mouth daily. 04/14/19 04/20/20  Ward, Layla Maw, DO    Family History History reviewed. No pertinent family history.  Social History Social History   Tobacco Use  . Smoking status: Never Smoker  . Smokeless tobacco: Never Used  Substance Use Topics  . Alcohol use: No    Alcohol/week: 0.0 standard drinks  . Drug use: No     Allergies   Patient has no known allergies.   Review of Systems Review of Systems  Reason unable to perform ROS: See HPI as above.     Physical Exam Triage Vital Signs ED Triage Vitals [04/20/20 1807]  Enc Vitals Group     BP 115/77     Pulse Rate 100     Resp 16     Temp 99.2 F (37.3 C)     Temp Source Oral     SpO2 99 %     Weight      Height      Head Circumference      Peak Flow      Pain Score      Pain Loc      Pain Edu?      Excl. in GC?    No data found.  Updated Vital Signs BP 115/77 (BP Location: Left Arm)   Pulse 100   Temp 99.2 F (37.3 C) (Oral)   Resp  16   SpO2 99%   Physical Exam Constitutional:      General: She is not in acute distress.    Appearance: Normal appearance. She is not ill-appearing, toxic-appearing or diaphoretic.  HENT:     Head: Normocephalic and atraumatic.     Mouth/Throat:     Mouth: Mucous membranes are moist.     Pharynx: Oropharynx is clear. Uvula midline.     Tonsils: No tonsillar exudate. 1+ on the right. 1+ on the left.  Cardiovascular:     Rate and Rhythm: Normal rate and regular rhythm.     Heart sounds: Normal heart sounds. No murmur heard.  No friction rub. No gallop.   Pulmonary:     Effort: Pulmonary effort is normal. No accessory muscle usage, prolonged expiration, respiratory distress or retractions.     Comments: Lungs clear to auscultation without adventitious lung sounds. Musculoskeletal:     Cervical back: Normal range of motion and neck supple.  Neurological:     General: No focal  deficit present.     Mental Status: She is alert and oriented to person, place, and time.      UC Treatments / Results  Labs (all labs ordered are listed, but only abnormal results are displayed) Labs Reviewed  NOVEL CORONAVIRUS, NAA    EKG   Radiology No results found.  Procedures Procedures (including critical care time)  Medications Ordered in UC Medications - No data to display  Initial Impression / Assessment and Plan / UC Course  I have reviewed the triage vital signs and the nursing notes.  Pertinent labs & imaging results that were available during my care of the patient were reviewed by me and considered in my medical decision making (see chart for details).    No alarming signs on exam.  Covid testing ordered.  Patient works for [employer], will contact health at work tomorrow for further instructions.  Otherwise to quarantine for now.  Symptomatic treatment discussed.  Return precautions given.  Final Clinical Impressions(s) / UC Diagnoses   Final diagnoses:  Exposure to  COVID-19 virus  Sore throat  Cough  Body aches   ED Prescriptions    Medication Sig Dispense Auth. Provider   lidocaine (XYLOCAINE) 2 % solution 5-15 mL gurgle as needed 150 mL Belinda Fisher, PA-C     PDMP not reviewed this encounter.   Belinda Fisher, PA-C 04/20/20 1823

## 2020-04-20 NOTE — Discharge Instructions (Signed)
COVID PCR testing ordered. I would like you to quarantine until testing results. Please contact health at work tomorrow morning for further directions. Start lidocaine for sore throat, do not eat or drink for the next 40 mins after use as it can stunt your gag reflex. You can take over the counter flonase/nasacort to help with nasal congestion/drainage. Tylenol/motrin for pain and fever. Keep hydrated, urine should be clear to pale yellow in color. If experiencing shortness of breath, trouble breathing, go to the emergency department for further evaluation needed.

## 2020-04-22 LAB — NOVEL CORONAVIRUS, NAA: SARS-CoV-2, NAA: NOT DETECTED

## 2020-04-22 LAB — SARS-COV-2, NAA 2 DAY TAT

## 2020-04-23 ENCOUNTER — Ambulatory Visit: Admit: 2020-04-23 | Discharge status: Against Medical Advice | Payer: BC Managed Care – PPO

## 2020-04-24 ENCOUNTER — Ambulatory Visit: Admit: 2020-04-24 | Discharge status: Absent without leave | Payer: BC Managed Care – PPO

## 2020-04-24 ENCOUNTER — Ambulatory Visit
Admission: EM | Admit: 2020-04-24 | Discharge: 2020-04-24 | Disposition: A | Discharge status: Home / Self Care | Payer: BC Managed Care – PPO | Attending: Physician Assistant | Admitting: Physician Assistant

## 2020-04-24 ENCOUNTER — Ambulatory Visit: Payer: Self-pay

## 2020-04-24 DIAGNOSIS — R05 Cough: Secondary | ICD-10-CM

## 2020-04-24 DIAGNOSIS — R200 Anesthesia of skin: Secondary | ICD-10-CM

## 2020-04-24 DIAGNOSIS — R059 Cough, unspecified: Secondary | ICD-10-CM

## 2020-04-24 MED ORDER — PREDNISONE 20 MG PO TABS: 40.0000 mg | ORAL_TABLET | Freq: Every day | ORAL | 0 refills | Status: AC

## 2020-04-24 NOTE — ED Provider Notes (Signed)
[facility]    CSN: [id number] Arrival date & time: 04/24/20  1258      History   Chief Complaint Chief Complaint  Patient presents with   Abdominal Pain    HPI Cassie Garrett is a 24 y.o. female.   24 year old female comes in for cough, upper abdominal tightness with left face/shoulder/arm numbness. Had been seen 04/20/2020 with complaint of cough, sore throat, body aches, COVID exposure. At the time, COVID testing was negative and was treated symptomatically. States since then, sore throat, body aches improving, but still having cough. States had had some numbness to the left face prior to being seen last visit, but did not mention, this has continued. Does have baseline numbness/tinglign to extremities due to anemia. Denies fever, facial pressure, facial drooping. Denies one sided weakness, confusion. States also noted bilateral lower rib tightness. Denies nausea/vomiting/shortness of breath.      History reviewed. No pertinent past medical history.  Patient Active Problem List   Diagnosis Date Noted   Anemia 05/14/2019    History reviewed. No pertinent surgical history.  OB History   No obstetric history on file.      Home Medications    Prior to Admission medications   Medication Sig Start Date End Date Taking? Authorizing Provider  predniSONE (DELTASONE) 20 MG tablet Take 2 tablets (40 mg total) by mouth daily with breakfast for 5 days. 04/24/20 04/29/20  Belinda Fisher, PA-C  pantoprazole (PROTONIX) 40 MG tablet Take 1 tablet (40 mg total) by mouth daily. 04/14/19 04/20/20  Ward, Layla Maw, DO    Family History History reviewed. No pertinent family history.  Social History Social History   Tobacco Use   Smoking status: Never Smoker   Smokeless tobacco: Never Used  Substance Use Topics   Alcohol use: No    Alcohol/week: 0.0 standard drinks   Drug use: No     Allergies   Patient has no known allergies.   Review of Systems Review of  Systems  Reason unable to perform ROS: See HPI as above.     Physical Exam Triage Vital Signs ED Triage Vitals  Enc Vitals Group     BP 04/24/20 1324 125/62     Pulse Rate 04/24/20 1324 100     Resp 04/24/20 1324 18     Temp 04/24/20 1324 99.4 F (37.4 C)     Temp Source 04/24/20 1324 Oral     SpO2 04/24/20 1324 98 %     Weight --      Height --      Head Circumference --      Peak Flow --      Pain Score 04/24/20 1325 0     Pain Loc --      Pain Edu? --      Excl. in GC? --    No data found.  Updated Vital Signs BP 125/62 (BP Location: Left Arm)    Pulse 100    Temp 99.4 F (37.4 C) (Oral)    Resp 18    LMP 03/28/2020    SpO2 98%   Physical Exam Constitutional:      General: She is not in acute distress.    Appearance: Normal appearance. She is well-developed. She is not ill-appearing, toxic-appearing or diaphoretic.  HENT:     Head: Normocephalic and atraumatic.     Right Ear: Tympanic membrane, ear canal and external ear normal. Tympanic membrane is not erythematous or bulging.  Left Ear: Tympanic membrane, ear canal and external ear normal. Tympanic membrane is not erythematous or bulging.     Nose:     Right Sinus: No maxillary sinus tenderness or frontal sinus tenderness.     Left Sinus: No maxillary sinus tenderness or frontal sinus tenderness.     Mouth/Throat:     Mouth: Mucous membranes are moist.     Pharynx: Oropharynx is clear. Uvula midline.  Eyes:     Conjunctiva/sclera: Conjunctivae normal.     Pupils: Pupils are equal, round, and reactive to light.  Cardiovascular:     Rate and Rhythm: Normal rate and regular rhythm.  Pulmonary:     Effort: Pulmonary effort is normal. No accessory muscle usage, prolonged expiration, respiratory distress or retractions.     Breath sounds: No decreased air movement or transmitted upper airway sounds. No decreased breath sounds.     Comments: LCTAB Abdominal:     General: Bowel sounds are normal.      Palpations: Abdomen is soft.     Tenderness: There is no abdominal tenderness. There is no guarding or rebound.  Musculoskeletal:     Cervical back: Normal range of motion and neck supple.  Skin:    General: Skin is warm and dry.  Neurological:     Mental Status: She is alert and oriented to person, place, and time.     Comments: Cranial nerves II-XII grossly intact. Strength 5/5 bilaterally for upper and lower extremity. Sensation intact and equal. Normal coordination with normal finger to nose, heel to shin. Negative pronator drift, romberg. Gait intact. Able to ambulate on own without difficulty.       UC Treatments / Results  Labs (all labs ordered are listed, but only abnormal results are displayed) Labs Reviewed - No data to display  EKG   Radiology No results found.  Procedures Procedures (including critical care time)  Medications Ordered in UC Medications - No data to display  Initial Impression / Assessment and Plan / UC Course  I have reviewed the triage vital signs and the nursing notes.  Pertinent labs & imaging results that were available during my care of the patient were reviewed by me and considered in my medical decision making (see chart for details).    No alarming signs on exam. Neurology exam grossly intact without focal deficits. Sensation intact and equal bilaterally. Will trial prednisone for symptomatic management. Return precautions given. patient expresses understanding and agrees to plan.  Final Clinical Impressions(s) / UC Diagnoses   Final diagnoses:  Cough  Facial numbness   ED Prescriptions    Medication Sig Dispense Auth. Provider   predniSONE (DELTASONE) 20 MG tablet Take 2 tablets (40 mg total) by mouth daily with breakfast for 5 days. 10 tablet Belinda Fisher, PA-C     PDMP not reviewed this encounter.   Belinda Fisher, PA-C 04/24/20 1408

## 2020-04-24 NOTE — ED Triage Notes (Signed)
Pt c/o cough, upper abdominal tightness with lt side of face/shoulder/arm numbness sinece 08/10. States was seen here on 08/12 with a neg covid test. States sx's are still the same.

## 2020-04-24 NOTE — Discharge Instructions (Signed)
Start prednisone as directed. Follow up with PCP if symptoms not improving. If having facial drooping, confusion, one sided weakness, go to the emergency department for further evaluation.

## 2020-06-09 ENCOUNTER — Other Ambulatory Visit: Payer: BC Managed Care – PPO

## 2020-06-09 DIAGNOSIS — Z20822 Contact with and (suspected) exposure to covid-19: Secondary | ICD-10-CM

## 2020-06-10 LAB — SARS-COV-2, NAA 2 DAY TAT

## 2020-06-10 LAB — NOVEL CORONAVIRUS, NAA: SARS-CoV-2, NAA: NOT DETECTED

## 2020-06-28 ENCOUNTER — Ambulatory Visit: Payer: Self-pay | Admitting: Dermatology

## 2020-11-15 ENCOUNTER — Other Ambulatory Visit: Payer: Self-pay | Admitting: Oncology

## 2020-11-15 DIAGNOSIS — D649 Anemia, unspecified: Secondary | ICD-10-CM

## 2020-11-28 ENCOUNTER — Ambulatory Visit: Payer: Self-pay | Admitting: Dermatology

## 2020-11-29 ENCOUNTER — Telehealth: Payer: Self-pay | Admitting: Oncology

## 2020-11-29 ENCOUNTER — Inpatient Hospital Stay: Payer: Self-pay | Admitting: Oncology

## 2020-11-29 ENCOUNTER — Inpatient Hospital Stay: Payer: BC Managed Care – PPO

## 2020-11-29 NOTE — Telephone Encounter (Signed)
Per sch msg, patient needs to r/s todays appt. Called and left msg with new date and time. Mailed printout

## 2020-12-27 ENCOUNTER — Ambulatory Visit: Payer: Self-pay | Admitting: Oncology

## 2020-12-27 ENCOUNTER — Other Ambulatory Visit: Payer: Self-pay

## 2021-01-31 ENCOUNTER — Inpatient Hospital Stay (HOSPITAL_BASED_OUTPATIENT_CLINIC_OR_DEPARTMENT_OTHER): Payer: PRIVATE HEALTH INSURANCE | Admitting: Oncology

## 2021-01-31 ENCOUNTER — Other Ambulatory Visit: Payer: Self-pay

## 2021-01-31 ENCOUNTER — Encounter: Payer: Self-pay | Admitting: Oncology

## 2021-01-31 ENCOUNTER — Inpatient Hospital Stay: Payer: PRIVATE HEALTH INSURANCE | Attending: Oncology

## 2021-01-31 VITALS — BP 119/75 | HR 97 | Temp 97.0°F | Resp 18 | Ht 64.0 in | Wt 170.0 lb

## 2021-01-31 DIAGNOSIS — D509 Iron deficiency anemia, unspecified: Secondary | ICD-10-CM | POA: Insufficient documentation

## 2021-01-31 DIAGNOSIS — R635 Abnormal weight gain: Secondary | ICD-10-CM | POA: Diagnosis not present

## 2021-01-31 DIAGNOSIS — M797 Fibromyalgia: Secondary | ICD-10-CM | POA: Insufficient documentation

## 2021-01-31 DIAGNOSIS — N920 Excessive and frequent menstruation with regular cycle: Secondary | ICD-10-CM | POA: Insufficient documentation

## 2021-01-31 DIAGNOSIS — Z79899 Other long term (current) drug therapy: Secondary | ICD-10-CM | POA: Diagnosis not present

## 2021-01-31 DIAGNOSIS — D649 Anemia, unspecified: Secondary | ICD-10-CM

## 2021-01-31 LAB — CBC WITH DIFFERENTIAL (CANCER CENTER ONLY)
Abs Immature Granulocytes: 0.01 10*3/uL (ref 0.00–0.07)
Basophils Absolute: 0 10*3/uL (ref 0.0–0.1)
Basophils Relative: 1 %
Eosinophils Absolute: 0 10*3/uL (ref 0.0–0.5)
Eosinophils Relative: 1 %
HCT: 29.7 % — ABNORMAL LOW (ref 36.0–46.0)
Hemoglobin: 8.2 g/dL — ABNORMAL LOW (ref 12.0–15.0)
Immature Granulocytes: 0 %
Lymphocytes Relative: 40 %
Lymphs Abs: 2 10*3/uL (ref 0.7–4.0)
MCH: 18.9 pg — ABNORMAL LOW (ref 26.0–34.0)
MCHC: 27.6 g/dL — ABNORMAL LOW (ref 30.0–36.0)
MCV: 68.4 fL — ABNORMAL LOW (ref 80.0–100.0)
Monocytes Absolute: 0.4 10*3/uL (ref 0.1–1.0)
Monocytes Relative: 8 %
Neutro Abs: 2.5 10*3/uL (ref 1.7–7.7)
Neutrophils Relative %: 50 %
Platelet Count: 525 10*3/uL — ABNORMAL HIGH (ref 150–400)
RBC: 4.34 MIL/uL (ref 3.87–5.11)
RDW: 17.8 % — ABNORMAL HIGH (ref 11.5–15.5)
WBC Count: 4.9 10*3/uL (ref 4.0–10.5)
nRBC: 0 % (ref 0.0–0.2)

## 2021-01-31 LAB — IRON AND TIBC
Iron: 15 ug/dL — ABNORMAL LOW (ref 41–142)
Saturation Ratios: 3 % — ABNORMAL LOW (ref 21–57)
TIBC: 496 ug/dL — ABNORMAL HIGH (ref 236–444)
UIBC: 482 ug/dL — ABNORMAL HIGH (ref 120–384)

## 2021-01-31 LAB — FERRITIN: Ferritin: <4 ng/mL — ABNORMAL LOW (ref 11–307)

## 2021-01-31 NOTE — Progress Notes (Signed)
Hematology and Oncology Follow Up Visit  Cassie Garrett [id number] [birth year] 25 y.o. 01/31/2021 2:39 PM Patient, No Pcp Per (Inactive)No ref. provider found   Principle Diagnosis: 25 year old woman with iron deficiency anemia related to chronic menstrual blood losses diagnosed in February 2020.    Prior therapy: Iron sucrose 200 mg infusion received on 3 separate occasions in 2020.  Current therapy: Oral iron therapy.    Interim History: Cassie Garrett is here for return evaluation.  Since last visit, Cassie Garrett reports no major changes in her health but does report more fatigue and tiredness.  Cassie Garrett has reported more weight gain and erratic menstrual cycles.  Cassie Garrett denies any hematochezia, melena or hemoptysis.  Cassie Garrett remains reasonably active without any decline ability to do so.  He has not reported any dyspnea on exertion or shortness of breath.        Medications: Updated on review. No current outpatient medications on file.   No current facility-administered medications for this visit.     Allergies: No Known Allergies     Physical Exam: Blood pressure 119/75, pulse 97, temperature (!) 97 F (36.1 C), temperature source Tympanic, resp. rate 18, height 5\' 4"  (1.626 m), weight 170 lb (77.1 kg), SpO2 100 %.    ECOG: 0    General appearance: Alert, awake without any distress. Head: Atraumatic without abnormalities Oropharynx: Without any thrush or ulcers. Eyes: No scleral icterus. Lymph nodes: No lymphadenopathy noted in the cervical, supraclavicular, or axillary nodes Heart:regular rate and rhythm, without any murmurs or gallops.   Lung: Clear to auscultation without any rhonchi, wheezes or dullness to percussion. Abdomin: Soft, nontender without any shifting dullness or ascites. Musculoskeletal: No clubbing or cyanosis. Neurological: No motor or sensory deficits. Skin: No rashes or lesions.    Lab Results: Lab Results  Component Value Date   WBC 4.2 05/14/2019   HGB 8.4  (L) 05/14/2019   HCT 31.0 (L) 05/14/2019   MCV 62.4 (L) 05/14/2019   PLT 530 (H) 05/14/2019     Chemistry      Component Value Date/Time   NA 139 04/14/2019 0004   K 2.9 (L) 04/14/2019 0004   CL 103 04/14/2019 0004   CO2 27 04/14/2019 0004   BUN 8 04/14/2019 0004   CREATININE 0.55 04/14/2019 0004      Component Value Date/Time   CALCIUM 9.5 04/14/2019 0004   ALKPHOS 49 04/14/2019 0004   AST 30 04/14/2019 0004   ALT 20 04/14/2019 0004   BILITOT 0.3 04/14/2019 0004       Impression and Plan:  25 year old woman with:  1.  Iron deficiency anemia related to chronic menstrual losses and poor absorption noted in 2020.  Disease status was updated today and treatment options were discussed.  He has been on oral iron therapy in the past with laboratory testing in 2020 showed slight improvement with hemoglobin improving from 7.5 to 8.4.  Cassie Garrett subsequently received intravenous iron in the form of iron sucrose.  Laboratory data from today reviewed and showed worsening anemia with iron studies pending but likely reflect consistent iron deficiency given her microcytosis and elevated RDW.  Management options moving forward including continuing oral iron therapy versus intravenous iron infusion were discussed.  Complication associated with IV iron infusion including arthralgias, myalgias and infusion related issues including anaphylaxis were reiterated.  Continuing oral iron therapy carries a risk of poor absorption and complication include dyspepsia and constipation.  After discussion today, Cassie Garrett is agreeable to proceed with intravenous iron which  we will arrange in the near future in the form of iron sucrose.    3.  Follow-up: In 6 months for repeat follow-up.  30  minutes were dedicated to this visit.  The time spent on reviewing laboratory data, discussing treatment options and complications related to therapy.    Eli Hose, MD 5/25/20222:39 PM

## 2021-02-12 ENCOUNTER — Telehealth: Payer: Self-pay | Admitting: Oncology

## 2021-02-12 NOTE — Telephone Encounter (Signed)
Scheduled per 05/25 los, patient has been called and notified of upcoming appointments. 

## 2021-02-15 ENCOUNTER — Other Ambulatory Visit: Payer: Self-pay

## 2021-02-15 ENCOUNTER — Inpatient Hospital Stay: Payer: PRIVATE HEALTH INSURANCE | Attending: Oncology

## 2021-02-15 VITALS — BP 112/60 | HR 94 | Temp 98.6°F

## 2021-02-15 DIAGNOSIS — Z79899 Other long term (current) drug therapy: Secondary | ICD-10-CM | POA: Insufficient documentation

## 2021-02-15 DIAGNOSIS — D509 Iron deficiency anemia, unspecified: Secondary | ICD-10-CM | POA: Diagnosis not present

## 2021-02-15 DIAGNOSIS — D649 Anemia, unspecified: Secondary | ICD-10-CM

## 2021-02-15 MED ORDER — ACETAMINOPHEN 325 MG PO TABS
ORAL_TABLET | ORAL | Status: AC
  Filled 2021-02-15: qty 2

## 2021-02-15 MED ORDER — DIPHENHYDRAMINE HCL 25 MG PO CAPS
25.0000 mg | ORAL_CAPSULE | Freq: Once | ORAL | Status: AC
  Administered 2021-02-15: 25 mg via ORAL

## 2021-02-15 MED ORDER — SODIUM CHLORIDE 0.9 % IV SOLN
200.0000 mg | Freq: Once | INTRAVENOUS | Status: AC
  Administered 2021-02-15: 200 mg via INTRAVENOUS
  Filled 2021-02-15: qty 200

## 2021-02-15 MED ORDER — ACETAMINOPHEN 325 MG PO TABS
650.0000 mg | ORAL_TABLET | Freq: Once | ORAL | Status: AC
  Administered 2021-02-15: 650 mg via ORAL

## 2021-02-15 MED ORDER — DIPHENHYDRAMINE HCL 25 MG PO CAPS
ORAL_CAPSULE | ORAL | Status: AC
  Filled 2021-02-15: qty 1

## 2021-02-15 MED ORDER — SODIUM CHLORIDE 0.9 % IV SOLN
Freq: Once | INTRAVENOUS | Status: AC
  Filled 2021-02-15: qty 250

## 2021-02-15 NOTE — Patient Instructions (Signed)

## 2021-02-21 ENCOUNTER — Ambulatory Visit: Payer: PRIVATE HEALTH INSURANCE

## 2021-02-22 ENCOUNTER — Inpatient Hospital Stay: Payer: PRIVATE HEALTH INSURANCE

## 2021-02-22 ENCOUNTER — Other Ambulatory Visit: Payer: Self-pay

## 2021-02-22 VITALS — BP 112/79 | HR 74 | Temp 98.0°F | Resp 16

## 2021-02-22 DIAGNOSIS — D509 Iron deficiency anemia, unspecified: Secondary | ICD-10-CM | POA: Diagnosis not present

## 2021-02-22 DIAGNOSIS — D649 Anemia, unspecified: Secondary | ICD-10-CM

## 2021-02-22 MED ORDER — SODIUM CHLORIDE 0.9 % IV SOLN
200.0000 mg | Freq: Once | INTRAVENOUS | Status: AC
  Administered 2021-02-22: 200 mg via INTRAVENOUS
  Filled 2021-02-22: qty 200

## 2021-02-22 MED ORDER — DIPHENHYDRAMINE HCL 25 MG PO CAPS
ORAL_CAPSULE | ORAL | Status: AC
  Filled 2021-02-22: qty 1

## 2021-02-22 MED ORDER — DIPHENHYDRAMINE HCL 25 MG PO CAPS
25.0000 mg | ORAL_CAPSULE | Freq: Once | ORAL | Status: AC
  Administered 2021-02-22: 25 mg via ORAL

## 2021-02-22 MED ORDER — SODIUM CHLORIDE 0.9 % IV SOLN
Freq: Once | INTRAVENOUS | Status: AC
  Filled 2021-02-22: qty 250

## 2021-02-22 MED ORDER — ACETAMINOPHEN 325 MG PO TABS
ORAL_TABLET | ORAL | Status: AC
  Filled 2021-02-22: qty 2

## 2021-02-22 MED ORDER — ACETAMINOPHEN 325 MG PO TABS
650.0000 mg | ORAL_TABLET | Freq: Once | ORAL | Status: AC
  Administered 2021-02-22: 650 mg via ORAL

## 2021-02-22 NOTE — Patient Instructions (Signed)

## 2021-02-28 ENCOUNTER — Other Ambulatory Visit: Payer: Self-pay

## 2021-02-28 ENCOUNTER — Inpatient Hospital Stay: Payer: PRIVATE HEALTH INSURANCE

## 2021-02-28 VITALS — BP 116/66 | HR 86 | Temp 98.2°F | Resp 18

## 2021-02-28 DIAGNOSIS — D509 Iron deficiency anemia, unspecified: Secondary | ICD-10-CM | POA: Diagnosis not present

## 2021-02-28 DIAGNOSIS — D649 Anemia, unspecified: Secondary | ICD-10-CM

## 2021-02-28 MED ORDER — SODIUM CHLORIDE 0.9 % IV SOLN
Freq: Once | INTRAVENOUS | Status: AC
  Filled 2021-02-28: qty 250

## 2021-02-28 MED ORDER — DIPHENHYDRAMINE HCL 25 MG PO CAPS
25.0000 mg | ORAL_CAPSULE | Freq: Once | ORAL | Status: AC
  Administered 2021-02-28: 25 mg via ORAL

## 2021-02-28 MED ORDER — ACETAMINOPHEN 325 MG PO TABS
650.0000 mg | ORAL_TABLET | Freq: Once | ORAL | Status: AC
  Administered 2021-02-28: 650 mg via ORAL

## 2021-02-28 MED ORDER — SODIUM CHLORIDE 0.9 % IV SOLN
200.0000 mg | Freq: Once | INTRAVENOUS | Status: AC
  Administered 2021-02-28: 200 mg via INTRAVENOUS
  Filled 2021-02-28: qty 200

## 2021-02-28 NOTE — Patient Instructions (Signed)

## 2021-03-07 ENCOUNTER — Inpatient Hospital Stay: Payer: PRIVATE HEALTH INSURANCE

## 2021-03-07 ENCOUNTER — Other Ambulatory Visit: Payer: Self-pay

## 2021-03-07 VITALS — BP 123/81 | HR 78 | Temp 98.2°F | Resp 16

## 2021-03-07 DIAGNOSIS — D509 Iron deficiency anemia, unspecified: Secondary | ICD-10-CM | POA: Diagnosis not present

## 2021-03-07 DIAGNOSIS — D649 Anemia, unspecified: Secondary | ICD-10-CM

## 2021-03-07 MED ORDER — ACETAMINOPHEN 325 MG PO TABS
650.0000 mg | ORAL_TABLET | Freq: Once | ORAL | Status: AC
  Administered 2021-03-07: 650 mg via ORAL

## 2021-03-07 MED ORDER — DIPHENHYDRAMINE HCL 25 MG PO CAPS
ORAL_CAPSULE | ORAL | Status: AC
  Filled 2021-03-07: qty 1

## 2021-03-07 MED ORDER — ACETAMINOPHEN 325 MG PO TABS
ORAL_TABLET | ORAL | Status: AC
  Filled 2021-03-07: qty 2

## 2021-03-07 MED ORDER — IRON SUCROSE 20 MG/ML IV SOLN
200.0000 mg | Freq: Once | INTRAVENOUS | Status: AC
  Administered 2021-03-07: 200 mg via INTRAVENOUS
  Filled 2021-03-07: qty 200

## 2021-03-07 MED ORDER — SODIUM CHLORIDE 0.9 % IV SOLN
Freq: Once | INTRAVENOUS | Status: AC
  Filled 2021-03-07: qty 250

## 2021-03-07 MED ORDER — DIPHENHYDRAMINE HCL 25 MG PO CAPS
25.0000 mg | ORAL_CAPSULE | Freq: Once | ORAL | Status: AC
  Administered 2021-03-07: 25 mg via ORAL

## 2021-03-07 NOTE — Patient Instructions (Signed)

## 2021-03-14 ENCOUNTER — Ambulatory Visit: Payer: PRIVATE HEALTH INSURANCE

## 2021-03-15 DIAGNOSIS — N390 Urinary tract infection, site not specified: Secondary | ICD-10-CM | POA: Insufficient documentation

## 2021-03-20 ENCOUNTER — Encounter: Payer: Self-pay | Admitting: Oncology

## 2021-03-26 ENCOUNTER — Inpatient Hospital Stay: Payer: PRIVATE HEALTH INSURANCE | Attending: Oncology

## 2021-03-26 ENCOUNTER — Other Ambulatory Visit: Payer: Self-pay

## 2021-03-26 VITALS — BP 113/68 | HR 84 | Temp 97.8°F | Resp 16

## 2021-03-26 DIAGNOSIS — D649 Anemia, unspecified: Secondary | ICD-10-CM | POA: Diagnosis not present

## 2021-03-26 MED ORDER — SODIUM CHLORIDE 0.9 % IV SOLN
Freq: Once | INTRAVENOUS | Status: AC
  Filled 2021-03-26: qty 250

## 2021-03-26 MED ORDER — DIPHENHYDRAMINE HCL 25 MG PO CAPS
ORAL_CAPSULE | ORAL | Status: AC
  Filled 2021-03-26: qty 1

## 2021-03-26 MED ORDER — ACETAMINOPHEN 325 MG PO TABS
650.0000 mg | ORAL_TABLET | Freq: Once | ORAL | Status: AC
  Administered 2021-03-26: 650 mg via ORAL

## 2021-03-26 MED ORDER — SODIUM CHLORIDE 0.9 % IV SOLN
200.0000 mg | Freq: Once | INTRAVENOUS | Status: AC
  Administered 2021-03-26: 200 mg via INTRAVENOUS
  Filled 2021-03-26: qty 200

## 2021-03-26 MED ORDER — DIPHENHYDRAMINE HCL 25 MG PO CAPS
25.0000 mg | ORAL_CAPSULE | Freq: Once | ORAL | Status: AC
Start: 2021-03-26 — End: 2021-03-26
  Administered 2021-03-26: 25 mg via ORAL

## 2021-03-26 MED ORDER — ACETAMINOPHEN 325 MG PO TABS
ORAL_TABLET | ORAL | Status: AC
  Filled 2021-03-26: qty 2

## 2021-03-26 NOTE — Patient Instructions (Signed)

## 2021-09-26 ENCOUNTER — Inpatient Hospital Stay: Payer: PRIVATE HEALTH INSURANCE | Admitting: Oncology

## 2021-09-26 ENCOUNTER — Inpatient Hospital Stay: Payer: PRIVATE HEALTH INSURANCE

## 2021-10-30 ENCOUNTER — Telehealth: Payer: Self-pay | Admitting: Oncology

## 2021-10-30 ENCOUNTER — Inpatient Hospital Stay: Payer: PRIVATE HEALTH INSURANCE | Admitting: Oncology

## 2021-10-30 ENCOUNTER — Inpatient Hospital Stay: Payer: PRIVATE HEALTH INSURANCE

## 2021-10-30 NOTE — Telephone Encounter (Signed)
.  Called patient to schedule appointment per 2/20 inbasket, patient is aware of date and time.   °

## 2021-11-26 ENCOUNTER — Telehealth: Payer: Self-pay | Admitting: Oncology

## 2021-11-26 NOTE — Telephone Encounter (Signed)
Scheduled per 3/20 in basket, pt has been called and confirmed  ?

## 2021-11-27 ENCOUNTER — Inpatient Hospital Stay: Payer: PRIVATE HEALTH INSURANCE

## 2021-11-27 ENCOUNTER — Inpatient Hospital Stay: Payer: PRIVATE HEALTH INSURANCE | Admitting: Oncology

## 2021-12-07 ENCOUNTER — Ambulatory Visit (INDEPENDENT_AMBULATORY_CARE_PROVIDER_SITE_OTHER): Payer: PRIVATE HEALTH INSURANCE | Admitting: Allergy

## 2021-12-07 ENCOUNTER — Encounter: Payer: Self-pay | Admitting: Oncology

## 2021-12-07 ENCOUNTER — Encounter: Payer: Self-pay | Admitting: Allergy

## 2021-12-07 VITALS — BP 116/72 | HR 98 | Temp 98.4°F | Resp 16 | Ht 64.0 in | Wt 172.0 lb

## 2021-12-07 DIAGNOSIS — T781XXA Other adverse food reactions, not elsewhere classified, initial encounter: Secondary | ICD-10-CM

## 2021-12-07 DIAGNOSIS — T781XXD Other adverse food reactions, not elsewhere classified, subsequent encounter: Secondary | ICD-10-CM

## 2021-12-07 DIAGNOSIS — L2084 Intrinsic (allergic) eczema: Secondary | ICD-10-CM | POA: Diagnosis not present

## 2021-12-07 MED ORDER — PIMECROLIMUS 1 % EX CREA: TOPICAL_CREAM | Freq: Two times a day (BID) | CUTANEOUS | 5 refills | Status: DC | PRN

## 2021-12-07 MED ORDER — TRIAMCINOLONE ACETONIDE 0.1 % EX CREA: TOPICAL_CREAM | Freq: Two times a day (BID) | CUTANEOUS | 5 refills | Status: DC | PRN

## 2021-12-07 NOTE — Patient Instructions (Addendum)
Eczema ?- Bathe and soak for 5-10 minutes in warm water once a day. Pat dry.  Immediately apply the below cream prescribed to flared areas (red, irritated, dry, itchy, patchy, scaly, flaky) only. Wait several minutes and then apply your moisturizer all over.   ? ?To affected areas on the face and neck, apply: ?Elidel 1% ointment twice a day as needed. ?Be careful to avoid the eyes. ?To affected areas on the body (below the face and neck), apply: ?Triamcinolone 0.1 % cream twice a day as needed. ?With ointments be careful to avoid the armpits and groin area. ?-Make a note of any foods that make eczema worse. ?- Keep finger nails trimmed. ?- Environmental allergy testing is negative ?- Food allergy testing is negative ?- We discussed today other eczema control options if topical therapies above are not effective in managing eczema.  You would be eligible for the options including Dupixent and/or Adbry which are the injectable medications as well as the oral medications of Rinvoq or Cinbinqo.  We have provided information on Dupixent and Rinvoq.  Will see how the topical therapies work and discuss further steps at next visit.  ? ?Adverse food reaction ?- Mushroom skin testing is negative.  If interested in any mushrooms let us know and we can perform in-office food challenge ? ?Follow-up in 3 months or sooner if needed ? ?

## 2021-12-07 NOTE — Progress Notes (Signed)
? ? ?New Patient Note ? ?RE: Cassie ShadeLisa L Wendorff MRN: [id number] DOB: 10/13/1995 ?Date of Office Visit: 12/07/2021 ? ?Referring provider: Judee ClaraKashdan, Daniel N, FNP ?Primary care provider: none ? ?Chief Complaint: eczema ? ?History of present illness: ?Cassie ShadeLisa L Garrett is a 26 y.o. female presenting today for evaluation of eczema.   ? ?She has history of eczema her whole life. She states she has eczema "every where".  It seems to flare up more with the changes in the weather.  She states she can also have new patches and she is not sure if something is triggering it that she is allergic to.  She states her clothes rubbing against skin can even flare it up.  Thus she would like to have allergy testing.   ?She reports when it flares it is red, itchy, rough and can get flaky/scaly.  As an adult has not noted any blistering.  However she states that she has to get scratched so much that it could blister. ?She did have triamcinolone cream but ran out. she does not recall trying other topical therapies.  Moisturizes with vaseline after bathing twice a day.  ? ?She states mushrooms make her sick and lead to vomiting.  ? ?She denies a seasonal or perennial allergy symptoms as an adult.  She states she has likely had seasonal allergies as a child as she does remember getting Claritin to take. ? ?No history of asthma. ? ?Review of systems: ?Review of Systems  ?Constitutional: Negative.   ?HENT: Negative.    ?Eyes: Negative.   ?Respiratory: Negative.    ?Cardiovascular: Negative.   ?Gastrointestinal: Negative.   ?Musculoskeletal: Negative.   ?Skin:  Positive for rash.  ?Allergic/Immunologic: Negative.   ?Neurological: Negative.   ? ?All other systems negative unless noted above in HPI ? ?Past medical history: ?Past Medical History:  ?Diagnosis Date  ? Eczema   ? ? ?Past surgical history: ?History reviewed. No pertinent surgical history. ? ?Family history:  ?History reviewed. No pertinent family history. ? ?Social history: ?Lives in a home  without carpeting with electric heating and central cooling.  There is no concern for water damage, mildew or roaches in the home.  There are no pets in the home.  He is a medical assistant/CMA.  She does report a smoking/vaping history starting in June 2022. ? ? ?Medication List: ?Current Outpatient Medications  ?Medication Sig Dispense Refill  ? LORYNA 3-0.02 MG tablet Take 1 tablet by mouth daily.    ? pimecrolimus (ELIDEL) 1 % cream Apply topically 2 (two) times daily as needed (Eczema flare). 100 g 5  ? triamcinolone cream (KENALOG) 0.1 % Apply topically 2 (two) times daily as needed (Eczema). 453.6 g 5  ? ?No current facility-administered medications for this visit.  ? ? ?Known medication allergies: ?No Known Allergies ? ? ?Physical examination: ?Blood pressure 116/72, pulse 98, temperature 98.4 ?F (36.9 ?C), resp. rate 16, height 5\' 4"  (1.626 m), weight 172 lb (78 kg), SpO2 97 %. ? ?General: Alert, interactive, in no acute distress. ?HEENT: PERRLA, TMs pearly gray, turbinates non-edematous without discharge, post-pharynx non erythematous. ?Neck: Supple without lymphadenopathy. ?Lungs: Clear to auscultation without wheezing, rhonchi or rales. {no increased work of breathing. ?CV: Normal S1, S2 without murmurs. ?Abdomen: Nondistended, nontender. ?Skin: Numerous dry, scaly, hyperpigmented patches to plaques on the back, arms . ?Extremities:  No clubbing, cyanosis or edema. ?Neuro:   Grossly intact. ? ?Diagnositics/Labs: ? ?Allergy testing:  ? Airborne Adult Perc - 12/07/21 1100   ? ?  Time Antigen Placed 1117   ? Allergen Manufacturer Waynette Buttery   ? Location Back   ? Number of Test 59   ? 2. Control-Histamine 1 mg/ml 2+   ? 4. Bahia Negative   ? 5. French Southern Territories Negative   ? 6. Johnson Negative   ? 7. Kentucky Blue Negative   ? 8. Meadow Fescue Negative   ? 9. Perennial Rye Negative   ? 10. Sweet Vernal Negative   ? 11. Timothy Negative   ? 12. Cocklebur Negative   ? 13. Burweed Marshelder Negative   ? 14. Ragweed, short  Negative   ? 15. Ragweed, Giant Negative   ? 16. Plantain,  English Negative   ? 17. Lamb's Quarters Negative   ? 18. Sheep Sorrell Negative   ? 19. Rough Pigweed Negative   ? 20. Marsh Elder, Rough Negative   ? 21. Mugwort, Common Negative   ? 22. Ash mix Negative   ? 23. Charletta Cousin mix Negative   ? 24. Beech American Negative   ? 25. Box, Elder Negative   ? 26. Cedar, red Negative   ? 27. Cottonwood, Guinea-Bissau Negative   ? 28. Elm mix Negative   ? 29. Hickory Negative   ? 30. Maple mix Negative   ? 31. Oak, Guinea-[place]issau mix Negative   ? 32. Pecan Pollen Negative   ? 33. Pine mix Negative   ? 34. Sycamore Eastern Negative   ? 35. Walnut, Black Pollen Negative   ? 36. Alternaria alternata Negative   ? 37. Cladosporium Herbarum Negative   ? 38. Aspergillus mix Negative   ? 39. Penicillium mix Negative   ? 40. Bipolaris sorokiniana (Helminthosporium) Negative   ? 41. Drechslera spicifera (Curvularia) Negative   ? 42. Mucor plumbeus Negative   ? 43. Fusarium moniliforme Negative   ? 44. Aureobasidium pullulans (pullulara) Negative   ? 45. Rhizopus oryzae Negative   ? 46. Botrytis cinera Negative   ? 47. Epicoccum nigrum Negative   ? 48. Phoma betae Negative   ? 49. Candida Albicans Negative   ? 50. Trichophyton mentagrophytes Negative   ? 51. Mite, D Farinae  5,000 AU/ml Negative   ? 52. Mite, D Pteronyssinus  5,000 AU/ml Negative   ? 53. Cat Hair 10,000 BAU/ml Negative   ? 54.  Dog Epithelia Negative   ? 55. Mixed Feathers Negative   ? 56. Horse Epithelia Negative   ? 57. Cockroach, [place] Negative   ? 58. Mouse Negative   ? 59. Tobacco Leaf Negative   ? ?  ?  ? ?  ? ? Food Adult Perc - 12/07/21 1100   ? ? Time Antigen Placed 1117   ? Allergen Manufacturer Waynette Buttery   ? Location Back   ? Number of allergen test 11   ? 1. Peanut Negative   ? 2. Soybean Negative   ? 3. Wheat Negative   ? 4. Sesame Negative   ? 5. Milk, cow Negative   ? 6. Egg White, Chicken Negative   ? 7. Casein Negative   ? 8. Shellfish Mix Negative   ? 9. Fish Mix  Negative   ? 10. Cashew Negative   ? 47. Mushrooms Negative   ? ?  ?  ? ?  ?  ?Allergy testing results were read and interpreted by provider, documented by clinical staff. ? ? ?Assessment and plan: ?Eczema ?- Bathe and soak for 5-10 minutes in warm water once a day. Pat dry.  Immediately apply  the below cream prescribed to flared areas (red, irritated, dry, itchy, patchy, scaly, flaky) only. Wait several minutes and then apply your moisturizer all over.   ? ?To affected areas on the face and neck, apply: ?Elidel 1% ointment twice a day as needed. ?Be careful to avoid the eyes. ?To affected areas on the body (below the face and neck), apply: ?Triamcinolone 0.1 % cream twice a day as needed. ?With ointments be careful to avoid the armpits and groin area. ?-Make a note of any foods that make eczema worse. ?- Keep finger nails trimmed. ?- Environmental allergy testing is negative ?- Food allergy testing is negative ?- We discussed today other eczema control options if topical therapies above are not effective in managing eczema.  You would be eligible for the options including Dupixent and/or Adbry which are the injectable medications as well as the oral medications of Rinvoq or Cinbinqo.  We have provided information on Dupixent and Rinvoq.  Will see how the topical therapies work and discuss further steps at next visit.  ? ?Adverse food reaction ?- Mushroom skin testing is negative.  If interested in any mushrooms let us know and we can perform in-office food challenge ? ?Follow-up in 3 months or sooner if needed ? ?I appreciate the opportunity to take part in Toluwanimi's care. Please do not hesitate to contact me with questions. ? ?Sincerely, ? ? ?Margo Aye, MD ?Allergy/Immunology ?[facility] ?

## 2021-12-17 ENCOUNTER — Telehealth: Payer: Self-pay | Admitting: Allergy

## 2021-12-17 NOTE — Telephone Encounter (Signed)
Cassie Garrett called in and states that her insurance didn't cover Elidel and wasn't sure if there was something else that could be called in.  [facility] uses [facility] on [address]  ?

## 2021-12-17 NOTE — Telephone Encounter (Signed)
Will work on [employer] for [employer].  ?

## 2021-12-20 NOTE — Telephone Encounter (Signed)
Called pharmacy to obtain insurance information, they stated that they did not have insurance on file for the patient, just coupon discount cards. I attempted to call the patient to get more information, the phone rang and just went silent and never went to voicemail. Will attempt to call again.  ?

## 2021-12-21 ENCOUNTER — Other Ambulatory Visit: Payer: Self-pay | Admitting: *Deleted

## 2021-12-21 MED ORDER — PIMECROLIMUS 1 % EX CREA: TOPICAL_CREAM | Freq: Two times a day (BID) | CUTANEOUS | 5 refills | Status: DC | PRN

## 2021-12-21 NOTE — Telephone Encounter (Signed)
Called and left a voicemail asking for return call to discuss.  ?

## 2021-12-21 NOTE — Telephone Encounter (Signed)
Patient called and stated that the [facility] on [address] has her insurance on file and to send the prescription there. Refill has been sent to requested pharmacy. Will work on [place].  ?

## 2021-12-24 NOTE — Telephone Encounter (Signed)
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 When attempted PA it stated there was no patient found. Will call patient and confirm insurance with her.  ?

## 2021-12-26 ENCOUNTER — Other Ambulatory Visit: Payer: Self-pay | Admitting: *Deleted

## 2021-12-26 NOTE — Telephone Encounter (Signed)
Called patient and left a voicemail asking for return call to discuss.  ?

## 2022-01-07 NOTE — Telephone Encounter (Signed)
Patient called in - DOB verified - patient advised of new insurance - copy in chart. Advised will enter new insurance information for PA for Elidel.  ? ?Patient verbalized understanding, no further questions.  ?

## 2022-01-08 ENCOUNTER — Inpatient Hospital Stay: Payer: PRIVATE HEALTH INSURANCE

## 2022-01-08 ENCOUNTER — Inpatient Hospital Stay: Payer: PRIVATE HEALTH INSURANCE | Admitting: Oncology

## 2022-01-10 MED ORDER — TACROLIMUS 0.1 % EX OINT
TOPICAL_OINTMENT | CUTANEOUS | 1 refills | Status: DC
Start: 2022-01-10 — End: 2022-01-18

## 2022-01-10 NOTE — Telephone Encounter (Addendum)
Per [facility], alternative : Tacrolimus (Protopic) 0.1% ointment - No PA required. ?  ?Per provider - okay to change from Pimecrolimus (Elidel) 1% to Tacrolimus 0.1% - apply topically 2 ( two) times daily as needed for eczema flare. Rx electronically sent to pharmacy on file. ?  ?Called patient - unable to LMOVM, per recording mailbox is full. I will send a myChart message. ?  ?

## 2022-01-10 NOTE — Addendum Note (Signed)
Addended by: Areta Haber B on: 01/10/2022 10:59 AM ? ? Modules accepted: Orders ? ?

## 2022-01-10 NOTE — Telephone Encounter (Signed)
Per [facility], alternative : Tacrolimus (Protopic) 0.1% ointment - No PA required. ?  ?Per provider - okay to change from Pimecrolimus (Elidel) 1% to Tacrolimus 0.1% - apply topically 2 ( two) times daily as needed for eczema flare. Rx electronically sent to pharmacy on file. ?  ?Called patient - unable to LMOVM, per recording mailbox is full. I will send a myChart message. ?  ?

## 2022-01-16 ENCOUNTER — Ambulatory Visit
Admission: EM | Admit: 2022-01-16 | Discharge: 2022-01-16 | Disposition: A | Discharge status: Home / Self Care | Payer: PRIVATE HEALTH INSURANCE | Attending: Physician Assistant | Admitting: Physician Assistant

## 2022-01-16 ENCOUNTER — Encounter: Payer: Self-pay | Admitting: Oncology

## 2022-01-16 ENCOUNTER — Encounter: Payer: Self-pay | Admitting: Emergency Medicine

## 2022-01-16 DIAGNOSIS — J029 Acute pharyngitis, unspecified: Secondary | ICD-10-CM | POA: Diagnosis not present

## 2022-01-16 LAB — POCT RAPID STREP A (OFFICE): Rapid Strep A Screen: NEGATIVE

## 2022-01-16 LAB — POCT MONO SCREEN (KUC): Mono, POC: NEGATIVE

## 2022-01-16 MED ORDER — LIDOCAINE VISCOUS HCL 2 % MT SOLN: 15.0000 mL | Freq: Four times a day (QID) | OROMUCOSAL | 0 refills | Status: DC | PRN

## 2022-01-16 NOTE — Discharge Instructions (Signed)
Your strep and mono testing were negative in clinic.  I believe that you have a virus causing your symptoms.  Please gargle with warm salt water several times per day.  Use lidocaine as needed to help with pain relief.  Do not eat or drink immediately after taking this medication as it can increase your risk of choking.  Make sure you are drinking plenty of fluids.  Alternate Tylenol or Profen for pain.  We will contact you if your throat culture is positive and we need to start any medication.  If you develop any high fever, difficulty swallowing, difficulty speaking, muffled voice, shortness of breath, nausea/vomiting you need to be seen immediately. ?

## 2022-01-16 NOTE — ED Triage Notes (Signed)
Patient c/o sore throat and headache x 2 days.  Right side of patient's throat is worse than other.  Patient denies taken any OTC meds. ?

## 2022-01-16 NOTE — ED Provider Notes (Signed)
?[facility] ? ? ? ?CSN: [id number] ?Arrival date & time: 01/16/22  1330 ? ? ?  ? ?History   ?Chief Complaint ?Chief Complaint  ?Patient presents with  ? Sore Throat  ? ? ?HPI ?Cassie Garrett is a 26 y.o. female.  ? ?Patient presents today with a several day history of sore throat.  She reports associated headache as well as subjective fever.  Denies any congestion, cough, chest pain, shortness of breath, nausea/vomiting.  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  She has tried Benadryl without improvement of symptoms.  Reports pain is rated 7 on a 0-10 pain scale, described as sharp, worse with swallowing, no alleviating factors identified.  Denies any throat swelling, fever, dysphagia, odynophagia, muffled voice.  Denies any recent antibiotics. ? ? ?Past Medical History:  ?Diagnosis Date  ? Eczema   ? ? ?Patient Active Problem List  ? Diagnosis Date Noted  ? Anemia 05/14/2019  ? ? ?History reviewed. No pertinent surgical history. ? ?OB History   ?No obstetric history on file. ?  ? ? ? ?Home Medications   ? ?Prior to Admission medications   ?Medication Sig Start Date End Date Taking? Authorizing Provider  ?lidocaine (XYLOCAINE) 2 % solution Use as directed 15 mLs in the mouth or throat every 6 (six) hours as needed for mouth pain. 01/16/22  Yes Rashmi Tallent K, PA-C  ?LORYNA 3-0.02 MG tablet Take 1 tablet by mouth daily. 11/13/21  Yes [provider]  ?pimecrolimus (ELIDEL) 1 % cream Apply topically 2 (two) times daily as needed (Eczema flare). 12/21/21  Yes Padgett, Pilar GrammesShaylar Patricia, MD  ?tacrolimus (PROTOPIC) 0.1 % ointment Apply 2 (Two) times daily as needed for eczema flare. 01/10/22  Yes Padgett, Pilar GrammesShaylar Patricia, MD  ?triamcinolone cream (KENALOG) 0.1 % Apply topically 2 (two) times daily as needed (Eczema). 12/07/21  Yes Marcelyn BruinsPadgett, Shaylar Patricia, MD  ? ? ?Family History ?History reviewed. No pertinent family history. ? ?Social History ?Social  History  ? ?Tobacco Use  ? Smoking status: Never  ? Smokeless tobacco: Never  ?Substance Use Topics  ? Alcohol use: No  ?  Alcohol/week: 0.0 standard drinks  ? Drug use: No  ? ? ? ?Allergies   ?Patient has no known allergies. ? ? ?Review of Systems ?Review of Systems  ?Constitutional:  Positive for activity change. Negative for appetite change, fatigue and fever.  ?HENT:  Positive for sore throat. Negative for congestion, sinus pressure, sneezing, trouble swallowing and voice change.   ?Respiratory:  Negative for cough and shortness of breath.   ?Cardiovascular:  Negative for chest pain.  ?Gastrointestinal:  Negative for abdominal pain, diarrhea, nausea and vomiting.  ?Neurological:  Negative for dizziness, light-headedness and headaches.  ? ? ?Physical Exam ?Triage Vital Signs ?ED Triage Vitals  ?Enc Vitals Group  ?   BP 01/16/22 1426 120/76  ?   Pulse Rate 01/16/22 1426 (!) 103  ?   Resp 01/16/22 1426 18  ?   Temp 01/16/22 1426 98.1 ?F (36.7 ?C)  ?   Temp Source 01/16/22 1426 Oral  ?   SpO2 01/16/22 1426 98 %  ?   Weight 01/16/22 1427 170 lb (77.1 kg)  ?   Height 01/16/22 1427 5\' 4"  (1.626 m)  ?   Head Circumference --   ?   Peak Flow --   ?   Pain Score 01/16/22 1426 7  ?   Pain Loc --   ?  Pain Edu? --   ?   Excl. in GC? --   ? ?No data found. ? ?Updated Vital Signs ?BP 120/76 (BP Location: Left Arm)   Pulse (!) 103   Temp 98.1 ?F (36.7 ?C) (Oral)   Resp 18   Ht 5\' 4"  (1.626 m)   Wt 170 lb (77.1 kg)   LMP 01/14/2022   SpO2 98%   BMI 29.18 kg/m?  ? ?Visual Acuity ?Right Eye Distance:   ?Left Eye Distance:   ?Bilateral Distance:   ? ?Right Eye Near:   ?Left Eye Near:    ?Bilateral Near:    ? ?Physical Exam ?Vitals reviewed.  ?Constitutional:   ?   General: She is awake. She is not in acute distress. ?   Appearance: Normal appearance. She is well-developed. She is not ill-appearing.  ?   Comments: Very pleasant female appears stated age in no acute distress sitting comfortably in exam room  ?HENT:  ?    Head: Normocephalic and atraumatic.  ?   Right Ear: Tympanic membrane, ear canal and external ear normal. Tympanic membrane is not erythematous or bulging.  ?   Left Ear: Tympanic membrane, ear canal and external ear normal. Tympanic membrane is not erythematous or bulging.  ?   Nose:  ?   Right Sinus: No maxillary sinus tenderness or frontal sinus tenderness.  ?   Left Sinus: No maxillary sinus tenderness or frontal sinus tenderness.  ?   Mouth/Throat:  ?   Pharynx: Uvula midline. Posterior oropharyngeal erythema present. No oropharyngeal exudate.  ?   Tonsils: No tonsillar exudate or tonsillar abscesses. 1+ on the right. 1+ on the left.  ?Cardiovascular:  ?   Rate and Rhythm: Normal rate and regular rhythm.  ?   Heart sounds: Normal heart sounds, S1 normal and S2 normal. No murmur heard. ?Pulmonary:  ?   Effort: Pulmonary effort is normal.  ?   Breath sounds: Normal breath sounds. No wheezing, rhonchi or rales.  ?   Comments: Clear to auscultation bilaterally ?Lymphadenopathy:  ?   Head:  ?   Right side of head: No submental, submandibular or tonsillar adenopathy.  ?   Left side of head: No submental, submandibular or tonsillar adenopathy.  ?   Cervical: No cervical adenopathy.  ?Psychiatric:     ?   Behavior: Behavior is cooperative.  ? ? ? ?UC Treatments / Results  ?Labs ?(all labs ordered are listed, but only abnormal results are displayed) ?Labs Reviewed  ?CULTURE, GROUP A STREP [facility])  ?POCT RAPID STREP A (OFFICE)  ?POCT MONO SCREEN [facility])  ? ? ?EKG ? ? ?Radiology ?No results found. ? ?Procedures ?Procedures (including critical care time) ? ?Medications Ordered in UC ?Medications - No data to display ? ?Initial Impression / Assessment and Plan / UC Course  ?I have reviewed the triage vital signs and the nursing notes. ? ?Pertinent labs & imaging results that were available during my care of the patient were reviewed by me and considered in my medical decision making (see chart for details). ? ?  ? ?Centor  score of 1.  Rapid strep was negative in clinic today.  Mono was negative.  Throat culture is pending but will defer antibiotic treatment until throat culture results are available.  Recommended conservative treatment measures including gargle with warm salt water and alternating Tylenol and ibuprofen.  Viral testing was deferred as she has no congestion or cough symptoms.  She was given lidocaine to help manage symptoms  with instruction not to eat or drink immediately after using this medication to prevent increased risk of choking.  Discussed that if she has any worsening symptoms including high fever not responding to medication, shortness of breath, throat swelling, odynophagia, dysphagia she needs to go to the emergency room to which she expressed understanding.  Strict return precautions given to which she expressed understanding.  Work excuse note provided. ? ?Final Clinical Impressions(s) / UC Diagnoses  ? ?Final diagnoses:  ?Sore throat  ?Viral pharyngitis  ? ? ? ?Discharge Instructions   ? ?  ?Your strep and mono testing were negative in clinic.  I believe that you have a virus causing your symptoms.  Please gargle with warm salt water several times per day.  Use lidocaine as needed to help with pain relief.  Do not eat or drink immediately after taking this medication as it can increase your risk of choking.  Make sure you are drinking plenty of fluids.  Alternate Tylenol or Profen for pain.  We will contact you if your throat culture is positive and we need to start any medication.  If you develop any high fever, difficulty swallowing, difficulty speaking, muffled voice, shortness of breath, nausea/vomiting you need to be seen immediately. ? ? ? ? ?ED Prescriptions   ? ? Medication Sig Dispense Auth. Provider  ? lidocaine (XYLOCAINE) 2 % solution Use as directed 15 mLs in the mouth or throat every 6 (six) hours as needed for mouth pain. 100 mL Denaisha Swango K, PA-C  ? ?  ? ?PDMP not reviewed this  encounter. ?  ?Jeani Hawking, PA-C ?01/16/22 1515 ? ?

## 2022-01-18 ENCOUNTER — Other Ambulatory Visit: Payer: Self-pay

## 2022-01-18 MED ORDER — TACROLIMUS 0.1 % EX OINT: TOPICAL_OINTMENT | CUTANEOUS | 1 refills | Status: DC

## 2022-01-19 LAB — CULTURE, GROUP A STREP (THRC)

## 2022-01-23 ENCOUNTER — Inpatient Hospital Stay: Payer: PRIVATE HEALTH INSURANCE | Attending: Oncology

## 2022-01-23 ENCOUNTER — Ambulatory Visit (INDEPENDENT_AMBULATORY_CARE_PROVIDER_SITE_OTHER)
Admission: EM | Admit: 2022-01-23 | Discharge: 2022-01-23 | Disposition: A | Discharge status: Home / Self Care | Payer: PRIVATE HEALTH INSURANCE | Source: Home / Self Care

## 2022-01-23 ENCOUNTER — Encounter: Payer: Self-pay | Admitting: *Deleted

## 2022-01-23 ENCOUNTER — Inpatient Hospital Stay (HOSPITAL_BASED_OUTPATIENT_CLINIC_OR_DEPARTMENT_OTHER): Payer: PRIVATE HEALTH INSURANCE | Admitting: Oncology

## 2022-01-23 ENCOUNTER — Other Ambulatory Visit: Payer: Self-pay

## 2022-01-23 VITALS — BP 125/76 | HR 62 | Temp 98.1°F | Resp 17 | Ht 64.0 in | Wt 169.1 lb

## 2022-01-23 DIAGNOSIS — D500 Iron deficiency anemia secondary to blood loss (chronic): Secondary | ICD-10-CM | POA: Diagnosis present

## 2022-01-23 DIAGNOSIS — D649 Anemia, unspecified: Secondary | ICD-10-CM | POA: Diagnosis not present

## 2022-01-23 DIAGNOSIS — R300 Dysuria: Secondary | ICD-10-CM

## 2022-01-23 HISTORY — DX: Iron deficiency: E61.1

## 2022-01-23 LAB — CBC WITH DIFFERENTIAL (CANCER CENTER ONLY)
Abs Immature Granulocytes: 0.02 10*3/uL (ref 0.00–0.07)
Basophils Absolute: 0.1 10*3/uL (ref 0.0–0.1)
Basophils Relative: 1 %
Eosinophils Absolute: 0 10*3/uL (ref 0.0–0.5)
Eosinophils Relative: 1 %
HCT: 42.5 % (ref 36.0–46.0)
Hemoglobin: 13.4 g/dL (ref 12.0–15.0)
Immature Granulocytes: 0 %
Lymphocytes Relative: 39 %
Lymphs Abs: 2.5 10*3/uL (ref 0.7–4.0)
MCH: 22.5 pg — ABNORMAL LOW (ref 26.0–34.0)
MCHC: 31.5 g/dL (ref 30.0–36.0)
MCV: 71.4 fL — ABNORMAL LOW (ref 80.0–100.0)
Monocytes Absolute: 0.5 10*3/uL (ref 0.1–1.0)
Monocytes Relative: 7 %
Neutro Abs: 3.3 10*3/uL (ref 1.7–7.7)
Neutrophils Relative %: 52 %
Platelet Count: 396 10*3/uL (ref 150–400)
RBC: 5.95 MIL/uL — ABNORMAL HIGH (ref 3.87–5.11)
RDW: 18.7 % — ABNORMAL HIGH (ref 11.5–15.5)
WBC Count: 6.4 10*3/uL (ref 4.0–10.5)
nRBC: 0 % (ref 0.0–0.2)

## 2022-01-23 LAB — POCT URINALYSIS DIP (MANUAL ENTRY)
Bilirubin, UA: NEGATIVE
Glucose, UA: NEGATIVE mg/dL
Ketones, POC UA: NEGATIVE mg/dL
Nitrite, UA: NEGATIVE
Protein Ur, POC: 100 mg/dL — AB
Spec Grav, UA: 1.025 (ref 1.010–1.025)
Urobilinogen, UA: 0.2 E.U./dL
pH, UA: 6.5 (ref 5.0–8.0)

## 2022-01-23 LAB — IRON AND IRON BINDING CAPACITY (CC-WL,HP ONLY)
Iron: 34 ug/dL (ref 28–170)
Saturation Ratios: 7 % — ABNORMAL LOW (ref 10.4–31.8)
TIBC: 466 ug/dL — ABNORMAL HIGH (ref 250–450)
UIBC: 432 ug/dL

## 2022-01-23 MED ORDER — URIBEL 118 MG PO CAPS: 1.0000 | ORAL_CAPSULE | Freq: Four times a day (QID) | ORAL | 0 refills | Status: AC

## 2022-01-23 NOTE — Discharge Instructions (Addendum)
Your urine sample does not show an active infection. ?We will send out your urine for a culture to confirm. ?Start taking uribel four times daily to help with pain and frequency. ?This medication will turn your urine blue. ?We will call if an antibiotic is required ?

## 2022-01-23 NOTE — ED Triage Notes (Signed)
Pt c/o dysuria and feeling like she needs to void even after voiding. Onset ~ 2 days ago.  ?

## 2022-01-23 NOTE — ED Provider Notes (Signed)
?[facility] ? ? ? ?CSN: [id number] ?Arrival date & time: 01/23/22  1616 ? ? ?  ? ?History   ?Chief Complaint ?Chief Complaint  ?Patient presents with  ? Urinary Frequency  ?  Possible UTI ,painful when peeing and lower back pain - Entered by patient  ? ? ?HPI ?Cassie Garrett is a 26 y.o. female.  ? ?Pleasant 26yo female presents today with 2 day hx of urinary frequency, pain and low back discomfort. Hx of urinary tract infections, feels this is similar. Denies any flank pain, fever, or hematuria. No pelvic pain or discharge. No rash or lesions. ? ? ?Urinary Frequency ? ? ?Past Medical History:  ?Diagnosis Date  ? Eczema   ? Iron deficiency   ? ? ?Patient Active Problem List  ? Diagnosis Date Noted  ? Anemia 05/14/2019  ? ? ?History reviewed. No pertinent surgical history. ? ?OB History   ?No obstetric history on file. ?  ? ? ? ?Home Medications   ? ?Prior to Admission medications   ?Medication Sig Start Date End Date Taking? Authorizing Provider  ?Meth-Hyo-M Bl-Na Phos-Ph Sal (URIBEL) 118 MG CAPS Take 1 capsule (118 mg total) by mouth in the morning, at noon, in the evening, and at bedtime for 10 days. 01/23/22 02/02/22 Yes Moniqua Engebretsen L, PA  ?triamcinolone cream (KENALOG) 0.1 % Apply topically 2 (two) times daily as needed (Eczema). 12/07/21   Kennith Gain, MD  ? ? ?Family History ?History reviewed. No pertinent family history. ? ?Social History ?Social History  ? ?Tobacco Use  ? Smoking status: Never  ? Smokeless tobacco: Never  ?Substance Use Topics  ? Alcohol use: No  ?  Alcohol/week: 0.0 standard drinks  ? Drug use: No  ? ? ? ?Allergies   ?Patient has no known allergies. ? ? ?Review of Systems ?Review of Systems  ?Genitourinary:  Positive for dysuria and frequency.  ?All other systems reviewed and are negative. ? ? ?Physical Exam ?Triage Vital Signs ?ED Triage Vitals [01/23/22 1644]  ?Enc Vitals Group  ?   BP 123/81  ?   Pulse Rate 96  ?   Resp 18  ?   Temp 98 ?F (36.7 ?C)  ?   Temp  Source Oral  ?   SpO2 98 %  ?   Weight   ?   Height   ?   Head Circumference   ?   Peak Flow   ?   Pain Score 0  ?   Pain Loc   ?   Pain Edu?   ?   Excl. in [place]?   ? ?No data found. ? ?Updated Vital Signs ?BP 123/81 (BP Location: Left Arm)   Pulse 96   Temp 98 ?F (36.7 ?C) (Oral)   Resp 18   LMP 01/14/2022   SpO2 98%  ? ?Visual Acuity ?Right Eye Distance:   ?Left Eye Distance:   ?Bilateral Distance:   ? ?Right Eye Near:   ?Left Eye Near:    ?Bilateral Near:    ? ?Physical Exam ?Vitals and nursing note reviewed.  ?Constitutional:   ?   General: She is not in acute distress. ?   Appearance: Normal appearance. She is normal weight. She is not ill-appearing, toxic-appearing or diaphoretic.  ?HENT:  ?   Head: Normocephalic and atraumatic.  ?Eyes:  ?   Pupils: Pupils are equal, round, and reactive to light.  ?Cardiovascular:  ?   Rate and Rhythm: Normal rate.  ?  Pulses: Normal pulses.  ?   Heart sounds: Normal heart sounds. No murmur heard. ?  No friction rub. No gallop.  ?Pulmonary:  ?   Effort: Pulmonary effort is normal. No respiratory distress.  ?   Breath sounds: Normal breath sounds. No wheezing or rales.  ?Chest:  ?   Chest wall: No tenderness.  ?Abdominal:  ?   General: Abdomen is flat. Bowel sounds are normal. There is no distension.  ?   Palpations: Abdomen is soft. There is no mass.  ?   Tenderness: There is no abdominal tenderness. There is no right CVA tenderness, left CVA tenderness, guarding or rebound.  ?   Hernia: No hernia is present.  ?Musculoskeletal:  ?   Right lower leg: No edema.  ?   Left lower leg: No edema.  ?Skin: ?   General: Skin is warm.  ?   Findings: No bruising, erythema or rash.  ?Neurological:  ?   General: No focal deficit present.  ?   Mental Status: She is alert. Mental status is at baseline.  ?Psychiatric:     ?   Mood and Affect: Mood normal.  ? ? ? ?UC Treatments / Results  ?Labs ?(all labs ordered are listed, but only abnormal results are displayed) ?Labs Reviewed  ?POCT  URINALYSIS DIP (MANUAL ENTRY) - Abnormal; Notable for the following components:  ?    Result Value  ? Blood, UA trace-intact (*)   ? Protein Ur, POC =100 (*)   ? Leukocytes, UA Trace (*)   ? All other components within normal limits  ? ? ?EKG ? ? ?Radiology ?No results found. ? ?Procedures ?Procedures (including critical care time) ? ?Medications Ordered in UC ?Medications - No data to display ? ?Initial Impression / Assessment and Plan / UC Course  ?I have reviewed the triage vital signs and the nursing notes. ? ?Pertinent labs & imaging results that were available during my care of the patient were reviewed by me and considered in my medical decision making (see chart for details). ? ?  ? ?Dysuria - UA sample negative for leuks or nitrates. Will send out culture. Uribel for now to aid with symptoms, increase water intake. RTC precautions discussed ? ?Final Clinical Impressions(s) / UC Diagnoses  ? ?Final diagnoses:  ?Dysuria  ? ? ? ?Discharge Instructions   ? ?  ?Your urine sample does not show an active infection. ?We will send out your urine for a culture to confirm. ?Start taking uribel four times daily to help with pain and frequency. ?This medication will turn your urine blue. ?We will call if an antibiotic is required ? ? ? ?ED Prescriptions   ? ? Medication Sig Dispense Auth. Provider  ? Meth-Hyo-M Bl-Na Phos-Ph Sal (URIBEL) 118 MG CAPS Take 1 capsule (118 mg total) by mouth in the morning, at noon, in the evening, and at bedtime for 10 days. 40 capsule Shabrea Weldin L, PA  ? ?  ? ?PDMP not reviewed this encounter. ?  Chaney Malling, [place] ?01/23/22 1716 ? ?

## 2022-01-23 NOTE — Progress Notes (Signed)
Hematology and Oncology Follow Up Visit ? ?Cassie Garrett ?[id number] ?Jun 17, 1996 26 y.o. ?01/23/2022 3:23 PM ?Patient, No Pcp Per (Inactive)No ref. provider found  ? ?Principle Diagnosis: 26 year old woman with iron deficiency anemia diagnosed in 2020.  Her anemia is related to chronic menstrual blood.   ? ?Prior therapy: IV iron infusions intermittently.  Last infusion completed in July 2022 which she received 1000 mg of iron sucrose. ? ?Current therapy: Multivitamin supplements. ? ?Interim History: Cassie Garrett is here for a follow-up visit.  Since last visit, she reports feeling well without any major complaints.  She denies any nausea, vomiting or abdominal pain.  She denies any excessive fatigue or tiredness.  She denies any heavy menstrual cycles.  He remains reasonably active without any issues.  She does take multivitamin supplements daily. ? ? ? ? ? ? ?Medications: Reviewed without changes. ?Current Outpatient Medications  ?Medication Sig Dispense Refill  ? lidocaine (XYLOCAINE) 2 % solution Use as directed 15 mLs in the mouth or throat every 6 (six) hours as needed for mouth pain. 100 mL 0  ? LORYNA 3-0.02 MG tablet Take 1 tablet by mouth daily.    ? tacrolimus (PROTOPIC) 0.1 % ointment Apply 2 (Two) times daily as needed for eczema flare. 100 g 1  ? triamcinolone cream (KENALOG) 0.1 % Apply topically 2 (two) times daily as needed (Eczema). 453.6 g 5  ? ?No current facility-administered medications for this visit.  ? ? ? ?Allergies: No Known Allergies ? ? ? ? ?Physical Exam: ? ?Blood pressure 125/76, pulse 62, temperature 98.1 ?F (36.7 ?C), temperature source Temporal, resp. rate 17, height 5\' 4"  (1.626 m), weight 169 lb 1.6 oz (76.7 kg), last menstrual period 01/14/2022, SpO2 100 %. ? ? ? ?ECOG: 0 ? ? ?General appearance: Comfortable appearing without any discomfort ?Head: Normocephalic without any trauma ?Oropharynx: Mucous membranes are moist and pink without any thrush or ulcers. ?Eyes: Pupils are equal  and round reactive to light. ?Lymph nodes: No cervical, supraclavicular, inguinal or axillary lymphadenopathy.   ?Heart:regular rate and rhythm.  S1 and S2 without leg edema. ?Lung: Clear without any rhonchi or wheezes.  No dullness to percussion. ?Abdomin: Soft, nontender, nondistended with good bowel sounds.  No hepatosplenomegaly. ?Musculoskeletal: No joint deformity or effusion.  Full range of motion noted. ?Neurological: No deficits noted on motor, sensory and deep tendon reflex exam. ?Skin: No petechial rash or dryness.  Appeared moist.  ? ? ? ? ?Lab Results: ?Lab Results  ?Component Value Date  ? WBC 4.9 01/31/2021  ? HGB 8.2 (L) 01/31/2021  ? HCT 29.7 (L) 01/31/2021  ? MCV 68.4 (L) 01/31/2021  ? PLT 525 (H) 01/31/2021  ? ?  Chemistry   ?   ?Component Value Date/Time  ? NA 139 04/14/2019 0004  ? K 2.9 (L) 04/14/2019 0004  ? CL 103 04/14/2019 0004  ? CO2 27 04/14/2019 0004  ? BUN 8 04/14/2019 0004  ? CREATININE 0.55 04/14/2019 0004  ?    ?Component Value Date/Time  ? CALCIUM 9.5 04/14/2019 0004  ? ALKPHOS 49 04/14/2019 0004  ? AST 30 04/14/2019 0004  ? ALT 20 04/14/2019 0004  ? BILITOT 0.3 04/14/2019 0004  ?  ? ? ? ?Impression and Plan: ? ?26 year old woman with: ?  ?1.  Anemia diagnosed in 2020.  She was found to have iron deficiency related to chronic menstrual blood losses and poor iron absorption. ?  ?She is status post intravenous iron infusion in the past with most recent  infusion given in July 2022.  Risks and benefits of repeat iron infusion were reiterated.  Complications that include arthralgias and myalgias and rarely anaphylaxis. ? ?There are laboratory data from today reviewed and showed normal hemoglobin with microcytosis.  Iron studies are currently pending.  Depending on her iron levels will likely recommend oral iron therapy alone without any need for intravenous iron. ? ?2.  Microcytosis: Related to iron deficiency could be an element of hemoglobinopathy which we will monitor on future  visits. ?  ? ?  ?3.  Follow-up: She will return in 6 months for follow-up. ?  ?30  minutes were spent on this encounter.  The time was dedicated to reviewing laboratory data, disease status update and outlining future plan of care discussion. ?  ?  ? ?Eli Hose, MD ?5/17/20233:23 PM ?

## 2022-01-24 LAB — URINE CULTURE: Culture: NO GROWTH

## 2022-01-24 LAB — FERRITIN: Ferritin: 4 ng/mL — ABNORMAL LOW (ref 11–307)

## 2022-02-07 ENCOUNTER — Ambulatory Visit: Payer: PRIVATE HEALTH INSURANCE

## 2022-02-08 ENCOUNTER — Ambulatory Visit: Payer: PRIVATE HEALTH INSURANCE

## 2022-02-09 ENCOUNTER — Ambulatory Visit: Payer: PRIVATE HEALTH INSURANCE

## 2022-03-17 ENCOUNTER — Ambulatory Visit: Payer: PRIVATE HEALTH INSURANCE

## 2022-03-19 DIAGNOSIS — N898 Other specified noninflammatory disorders of vagina: Secondary | ICD-10-CM | POA: Diagnosis not present

## 2022-03-19 DIAGNOSIS — R309 Painful micturition, unspecified: Secondary | ICD-10-CM | POA: Diagnosis not present

## 2022-04-01 DIAGNOSIS — N898 Other specified noninflammatory disorders of vagina: Secondary | ICD-10-CM | POA: Diagnosis not present

## 2022-04-01 DIAGNOSIS — Z8619 Personal history of other infectious and parasitic diseases: Secondary | ICD-10-CM | POA: Diagnosis not present

## 2022-04-03 ENCOUNTER — Telehealth: Payer: Self-pay | Admitting: Oncology

## 2022-04-03 NOTE — Telephone Encounter (Signed)
Called patient regarding November appointments, patient has been called and voicemail was left. 

## 2022-04-05 ENCOUNTER — Ambulatory Visit: Payer: PRIVATE HEALTH INSURANCE | Admitting: Allergy

## 2022-04-12 ENCOUNTER — Ambulatory Visit: Payer: PRIVATE HEALTH INSURANCE | Admitting: Allergy

## 2022-04-16 ENCOUNTER — Encounter: Payer: Self-pay | Admitting: Oncology

## 2022-06-25 DIAGNOSIS — R350 Frequency of micturition: Secondary | ICD-10-CM | POA: Diagnosis not present

## 2022-06-25 DIAGNOSIS — Z1159 Encounter for screening for other viral diseases: Secondary | ICD-10-CM | POA: Diagnosis not present

## 2022-06-25 DIAGNOSIS — Z01419 Encounter for gynecological examination (general) (routine) without abnormal findings: Secondary | ICD-10-CM | POA: Diagnosis not present

## 2022-06-25 DIAGNOSIS — Z113 Encounter for screening for infections with a predominantly sexual mode of transmission: Secondary | ICD-10-CM | POA: Diagnosis not present

## 2022-06-25 DIAGNOSIS — D509 Iron deficiency anemia, unspecified: Secondary | ICD-10-CM | POA: Diagnosis not present

## 2022-06-25 DIAGNOSIS — Z114 Encounter for screening for human immunodeficiency virus [HIV]: Secondary | ICD-10-CM | POA: Diagnosis not present

## 2022-06-25 DIAGNOSIS — N920 Excessive and frequent menstruation with regular cycle: Secondary | ICD-10-CM | POA: Diagnosis not present

## 2022-06-28 LAB — HM PAP SMEAR

## 2022-07-12 ENCOUNTER — Encounter: Payer: Self-pay | Admitting: Oncology

## 2022-07-12 ENCOUNTER — Encounter: Payer: Self-pay | Admitting: Allergy

## 2022-07-12 ENCOUNTER — Ambulatory Visit (INDEPENDENT_AMBULATORY_CARE_PROVIDER_SITE_OTHER): Payer: BC Managed Care – PPO | Admitting: Allergy

## 2022-07-12 VITALS — BP 130/70 | HR 97 | Temp 98.1°F | Resp 16 | Wt 185.6 lb

## 2022-07-12 DIAGNOSIS — L2084 Intrinsic (allergic) eczema: Secondary | ICD-10-CM

## 2022-07-12 DIAGNOSIS — T781XXD Other adverse food reactions, not elsewhere classified, subsequent encounter: Secondary | ICD-10-CM

## 2022-07-12 DIAGNOSIS — T7819XD Other adverse food reactions, not elsewhere classified, subsequent encounter: Secondary | ICD-10-CM

## 2022-07-12 MED ORDER — CLOBETASOL PROPIONATE 0.05 % EX OINT: 1.0000 | TOPICAL_OINTMENT | Freq: Two times a day (BID) | CUTANEOUS | 5 refills | Status: DC

## 2022-07-12 MED ORDER — TRIAMCINOLONE ACETONIDE 0.1 % EX CREA: TOPICAL_CREAM | Freq: Two times a day (BID) | CUTANEOUS | 5 refills | Status: DC | PRN

## 2022-07-12 NOTE — Patient Instructions (Addendum)
Eczema - Bathe and soak for 5-10 minutes in warm water once a day. Pat dry.  Immediately apply the below cream prescribed to flared areas (red, irritated, dry, itchy, patchy, scaly, flaky) only. Wait several minutes and then apply your moisturizer all over.    To affected areas on the face and neck, apply: Elidel 1% ointment twice a day as needed. Be careful to avoid the eyes. To affected areas on the body (below the face and neck), apply: Triamcinolone 0.1 % cream twice a day as needed.  Use for milder flares.  Clobetasol ointment twice a day as needed.  Use for more severe flares.  This is a strong steroid ointment.  With ointments be careful to avoid the armpits and groin area. -Make a note of any foods that make eczema worse. - Keep finger nails trimmed. - Environmental allergy testing is negative - Food allergy testing is negative - Will proceed with Dupixent add-on therapy for eczema control.  Benefits, risk and protocol have been discussed.  Tammy, nurse coordinator for injectable medications, will call you regarding approval process (if you get an [place] # call its likely Tammy).    Adverse food reaction - Mushroom skin testing is negative.  If interested in eating mushrooms let us know and we can perform in-office food challenge  Follow-up in 4-6 months or sooner if needed

## 2022-07-12 NOTE — Progress Notes (Unsigned)
    Follow-up Note  RE: Cassie Garrett MRN: [id number] DOB: [birth year] Date of Office Visit: 07/12/2022   History of present illness: Cassie Garrett is a 26 y.o. female presenting today for follow-up of eczema and adverse food reaction.  She was last seen in the office on 11/07/21 by myself.      She wants to discuss dupixent for her eczema.   Review of systems: Review of Systems  Constitutional: Negative.   HENT: Negative.    Eyes: Negative.   Respiratory: Negative.    Cardiovascular: Negative.   Gastrointestinal: Negative.   Musculoskeletal: Negative.   Skin:  Positive for rash.  Allergic/Immunologic: Negative.   Neurological: Negative.      All other systems negative unless noted above in HPI  Past medical/social/surgical/family history have been reviewed and are unchanged unless specifically indicated below.  No changes  Medication List: Current Outpatient Medications  Medication Sig Dispense Refill   clobetasol ointment (TEMOVATE) 6.26 % Apply 1 Application topically 2 (two) times daily. 30 g 5   triamcinolone cream (KENALOG) 0.1 % Apply topically 2 (two) times daily as needed (Eczema). 453.6 g 5   No current facility-administered medications for this visit.     Known medication allergies: No Known Allergies   Physical examination: Blood pressure 130/70, pulse 97, temperature 98.1 F (36.7 C), temperature source Temporal, resp. rate 16, weight 185 lb 9.6 oz (84.2 kg), SpO2 100 %.  General: Alert, interactive, in no acute distress. HEENT: PERRLA, TMs pearly gray, turbinates non-edematous without discharge, post-pharynx non erythematous. Neck: Supple without lymphadenopathy. Lungs: Clear to auscultation without wheezing, rhonchi or rales. {no increased work of breathing. CV: Normal S1, S2 without murmurs. Abdomen: Nondistended, nontender. Skin: Dry, hyperpigmented, thickened patches on the anterior thigh b/l . Extremities:  No clubbing, cyanosis or  edema. Neuro:   Grossly intact.  Diagnositics/Labs: None today  Assessment and plan:   Eczema - Bathe and soak for 5-10 minutes in warm water once a day. Pat dry.  Immediately apply the below cream prescribed to flared areas (red, irritated, dry, itchy, patchy, scaly, flaky) only. Wait several minutes and then apply your moisturizer all over.    To affected areas on the face and neck, apply: Elidel 1% ointment twice a day as needed. Be careful to avoid the eyes. To affected areas on the body (below the face and neck), apply: Triamcinolone 0.1 % cream twice a day as needed.  Use for milder flares.  Clobetasol ointment twice a day as needed.  Use for more severe flares.  This is a strong steroid ointment.  With ointments be careful to avoid the armpits and groin area. -Make a note of any foods that make eczema worse. - Keep finger nails trimmed. - Environmental allergy testing is negative - Food allergy testing is negative - Will proceed with Dupixent add-on therapy for eczema control.  Benefits, risk and protocol have been discussed.  Tammy, nurse coordinator for injectable medications, will call you regarding approval process (if you get an [place] # call its likely Tammy).    Adverse food reaction - Mushroom skin testing is negative.  If interested in eating mushrooms let us know and we can perform in-office food challenge  Follow-up in 4-6 months or sooner if needed  I appreciate the opportunity to take part in Cassie Garrett's care. Please do not hesitate to contact me with questions.  Sincerely,   Prudy Feeler, MD Allergy/Immunology [facility]

## 2022-07-15 ENCOUNTER — Encounter: Payer: Self-pay | Admitting: Oncology

## 2022-07-18 ENCOUNTER — Telehealth: Payer: Self-pay | Admitting: *Deleted

## 2022-07-18 ENCOUNTER — Other Ambulatory Visit (HOSPITAL_COMMUNITY): Payer: Self-pay

## 2022-07-18 ENCOUNTER — Encounter: Payer: Self-pay | Admitting: Oncology

## 2022-07-18 MED ORDER — DUPIXENT 300 MG/2ML ~~LOC~~ SOSY
600.0000 mg | PREFILLED_SYRINGE | Freq: Once | SUBCUTANEOUS | 11 refills | Status: AC
  Filled 2022-07-18: qty 4, 1d supply, fill #0
  Filled 2022-07-22: qty 4, 14d supply, fill #0
  Filled 2022-07-31: qty 4, 28d supply, fill #1
  Filled 2022-08-22: qty 4, 28d supply, fill #2
  Filled 2022-09-12: qty 4, 28d supply, fill #3
  Filled 2022-10-25: qty 4, 28d supply, fill #4
  Filled 2022-11-19: qty 4, 28d supply, fill #5
  Filled 2022-12-17: qty 4, 28d supply, fill #6

## 2022-07-18 NOTE — Telephone Encounter (Signed)
Called patient and advised approval, copay card and submit to [facility] for Dupixent. She wants to get injections in clinic so will reach out to her when delivery is scheduled to clinic

## 2022-07-18 NOTE — Telephone Encounter (Signed)
-----   Message from [facility] Larose Hires, MD sent at 07/12/2022  4:44 PM EDT ----- Can you start dupixent for eczema.  Thanks.

## 2022-07-22 ENCOUNTER — Other Ambulatory Visit (HOSPITAL_COMMUNITY): Payer: Self-pay

## 2022-07-22 ENCOUNTER — Encounter: Payer: Self-pay | Admitting: Oncology

## 2022-07-23 ENCOUNTER — Other Ambulatory Visit (HOSPITAL_COMMUNITY): Payer: Self-pay

## 2022-07-24 ENCOUNTER — Inpatient Hospital Stay (HOSPITAL_BASED_OUTPATIENT_CLINIC_OR_DEPARTMENT_OTHER): Payer: BC Managed Care – PPO | Admitting: Oncology

## 2022-07-24 ENCOUNTER — Inpatient Hospital Stay: Payer: BC Managed Care – PPO | Attending: Oncology

## 2022-07-24 ENCOUNTER — Other Ambulatory Visit (HOSPITAL_COMMUNITY): Payer: Self-pay

## 2022-07-24 ENCOUNTER — Other Ambulatory Visit: Payer: Self-pay

## 2022-07-24 ENCOUNTER — Encounter: Payer: Self-pay | Admitting: Oncology

## 2022-07-24 ENCOUNTER — Encounter: Payer: Self-pay | Admitting: *Deleted

## 2022-07-24 VITALS — BP 119/75 | HR 94 | Temp 98.9°F | Resp 18 | Ht 64.0 in | Wt 191.0 lb

## 2022-07-24 DIAGNOSIS — D649 Anemia, unspecified: Secondary | ICD-10-CM | POA: Diagnosis not present

## 2022-07-24 DIAGNOSIS — Z79899 Other long term (current) drug therapy: Secondary | ICD-10-CM | POA: Diagnosis not present

## 2022-07-24 DIAGNOSIS — R5383 Other fatigue: Secondary | ICD-10-CM | POA: Diagnosis not present

## 2022-07-24 DIAGNOSIS — N920 Excessive and frequent menstruation with regular cycle: Secondary | ICD-10-CM | POA: Insufficient documentation

## 2022-07-24 DIAGNOSIS — D509 Iron deficiency anemia, unspecified: Secondary | ICD-10-CM | POA: Insufficient documentation

## 2022-07-24 DIAGNOSIS — M791 Myalgia, unspecified site: Secondary | ICD-10-CM | POA: Insufficient documentation

## 2022-07-24 DIAGNOSIS — R718 Other abnormality of red blood cells: Secondary | ICD-10-CM | POA: Insufficient documentation

## 2022-07-24 LAB — CBC WITH DIFFERENTIAL (CANCER CENTER ONLY)
Abs Immature Granulocytes: 0.02 10*3/uL (ref 0.00–0.07)
Basophils Absolute: 0 10*3/uL (ref 0.0–0.1)
Basophils Relative: 1 %
Eosinophils Absolute: 0.1 10*3/uL (ref 0.0–0.5)
Eosinophils Relative: 1 %
HCT: 33.1 % — ABNORMAL LOW (ref 36.0–46.0)
Hemoglobin: 9.8 g/dL — ABNORMAL LOW (ref 12.0–15.0)
Immature Granulocytes: 0 %
Lymphocytes Relative: 32 %
Lymphs Abs: 2.3 10*3/uL (ref 0.7–4.0)
MCH: 20.8 pg — ABNORMAL LOW (ref 26.0–34.0)
MCHC: 29.6 g/dL — ABNORMAL LOW (ref 30.0–36.0)
MCV: 70.3 fL — ABNORMAL LOW (ref 80.0–100.0)
Monocytes Absolute: 0.6 10*3/uL (ref 0.1–1.0)
Monocytes Relative: 8 %
Neutro Abs: 4.2 10*3/uL (ref 1.7–7.7)
Neutrophils Relative %: 58 %
Platelet Count: 414 10*3/uL — ABNORMAL HIGH (ref 150–400)
RBC: 4.71 MIL/uL (ref 3.87–5.11)
RDW: 16.9 % — ABNORMAL HIGH (ref 11.5–15.5)
WBC Count: 7.1 10*3/uL (ref 4.0–10.5)
nRBC: 0 % (ref 0.0–0.2)

## 2022-07-24 NOTE — Progress Notes (Signed)
Hematology and Oncology Follow Up Visit  Cassie Garrett [id number] [birth year] 26 y.o. 07/24/2022 3:23 PM Pcp, NoNo ref. provider found   Principle Diagnosis: 26 year old woman with iron deficiency anemia related to chronic menstrual losses diagnosed in 2020.   Prior therapy: IV iron infusions as needed.  Last treatment completed in July 2022 for a total of 1000 mg of Venofer.  Current therapy: Oral iron supplements via multivitamins daily.  Interim History: Cassie Garrett presents today for a follow-up visit.  Since the last visit, she reports no major changes in her health.  She does report increased fatigue and tiredness but no excessive weakness.  She denies any chest pain or shortness of breath.  She denies any dyspnea on exertion.  She continues to have heavy menstrual bleeding but no hematochezia, melena or hemoptysis.  No hematuria or dysuria.  She continues to take multivitamin supplements although not consistent in taking it.       Medications: Updated on review. Current Outpatient Medications  Medication Sig Dispense Refill   clobetasol ointment (TEMOVATE) 0.05 % Apply 1 Application topically 2 (two) times daily. 30 g 5   dupilumab (DUPIXENT) 300 MG/2ML prefilled syringe Inject 600 mg into the skin once for 1 dose. Then 300mg  every 14 days 4 mL 11   triamcinolone cream (KENALOG) 0.1 % Apply topically 2 (two) times daily as needed (Eczema). 453.6 g 5   No current facility-administered medications for this visit.     Allergies: No Known Allergies     Physical Exam:   Blood pressure 119/75, pulse 94, temperature 98.9 F (37.2 C), temperature source Temporal, resp. rate 18, height 5\' 4"  (1.626 m), weight 191 lb (86.6 kg), SpO2 98 %.    ECOG: 0    General appearance: Alert, awake without any distress. Head: Atraumatic without abnormalities Oropharynx: Without any thrush or ulcers. Eyes: No scleral icterus. Lymph nodes: No lymphadenopathy noted in the cervical,  supraclavicular, or axillary nodes Heart:regular rate and rhythm, without any murmurs or gallops.   Lung: Clear to auscultation without any rhonchi, wheezes or dullness to percussion. Abdomin: Soft, nontender without any shifting dullness or ascites. Musculoskeletal: No clubbing or cyanosis. Neurological: No motor or sensory deficits. Skin: No rashes or lesions.     Lab Results: Lab Results  Component Value Date   WBC 6.4 01/23/2022   HGB 13.4 01/23/2022   HCT 42.5 01/23/2022   MCV 71.4 (L) 01/23/2022   PLT 396 01/23/2022     Chemistry      Component Value Date/Time   NA 139 04/14/2019 0004   K 2.9 (L) 04/14/2019 0004   CL 103 04/14/2019 0004   CO2 27 04/14/2019 0004   BUN 8 04/14/2019 0004   CREATININE 0.55 04/14/2019 0004      Component Value Date/Time   CALCIUM 9.5 04/14/2019 0004   ALKPHOS 49 04/14/2019 0004   AST 30 04/14/2019 0004   ALT 20 04/14/2019 0004   BILITOT 0.3 04/14/2019 0004       Impression and Plan:  26 year old woman with:   1.  Iron deficiency anemia related to chronic menstrual blood losses and poor iron absorption diagnosed in 2020.   The natural course of her disease was discussed at this time.  Risks and benefits of repeat intravenous iron infusion were reviewed.  Iron studies in May 2023 showed a ferritin of 4 although her hemoglobin was normal.  Complication associated with Venofer that include arthralgias, myalgias and rarely anaphylaxis were reiterated.  Laboratory data from  today reviewed and showed a hemoglobin is down to 9.8 with increased platelet count which likely indicate further decline in iron levels.  After discussion today, she is agreeable to proceed.  2.  Microcytosis: Improved with iron replacement.  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      3.  Follow-up: Will be in the near future to receive the IV iron infusion and follow-up in 6 months.   30  minutes were spent on this visit.  The time was dedicated to updating  disease status, treatment choices and outlining future plan of care review.      Eli Hose, MD 11/15/20233:23 PM

## 2022-07-25 ENCOUNTER — Telehealth: Payer: Self-pay

## 2022-07-25 LAB — FERRITIN: Ferritin: 2 ng/mL — ABNORMAL LOW (ref 11–307)

## 2022-07-25 NOTE — Telephone Encounter (Signed)
LM for pt with lab results and the need for IV iron.  Scheduling to call pt with date and time.

## 2022-07-25 NOTE — Telephone Encounter (Signed)
-----   Message from Barrie Folk, RN sent at 07/25/2022 10:09 AM EST -----  ----- Message ----- From: Benjiman Core, MD Sent: 07/25/2022   8:45 AM EST To: Barrie Folk, RN  Please let her know her iron is low and will need IV iron as we discussed

## 2022-07-26 ENCOUNTER — Telehealth: Payer: Self-pay | Admitting: Oncology

## 2022-07-26 NOTE — Telephone Encounter (Signed)
Scheduled per 11/15 los, patient has been called and notified of upcoming appointments.

## 2022-07-29 ENCOUNTER — Other Ambulatory Visit (HOSPITAL_COMMUNITY): Payer: Self-pay

## 2022-07-30 ENCOUNTER — Other Ambulatory Visit (HOSPITAL_COMMUNITY): Payer: Self-pay

## 2022-07-31 ENCOUNTER — Other Ambulatory Visit (HOSPITAL_COMMUNITY): Payer: Self-pay

## 2022-08-02 MED FILL — Iron Sucrose Inj 20 MG/ML (Fe Equiv): INTRAVENOUS | Qty: 10 | Status: AC

## 2022-08-03 ENCOUNTER — Other Ambulatory Visit (HOSPITAL_COMMUNITY): Payer: Self-pay

## 2022-08-03 ENCOUNTER — Inpatient Hospital Stay: Payer: BC Managed Care – PPO

## 2022-08-05 ENCOUNTER — Other Ambulatory Visit (HOSPITAL_COMMUNITY): Payer: Self-pay

## 2022-08-06 ENCOUNTER — Ambulatory Visit: Payer: BC Managed Care – PPO | Admitting: *Deleted

## 2022-08-06 ENCOUNTER — Ambulatory Visit: Payer: BC Managed Care – PPO

## 2022-08-06 ENCOUNTER — Telehealth: Payer: Self-pay | Admitting: *Deleted

## 2022-08-06 DIAGNOSIS — B3731 Acute candidiasis of vulva and vagina: Secondary | ICD-10-CM | POA: Diagnosis not present

## 2022-08-06 DIAGNOSIS — L209 Atopic dermatitis, unspecified: Secondary | ICD-10-CM

## 2022-08-06 NOTE — Telephone Encounter (Signed)
Patient came in today to start her Dupxient and accidentally received Tezspire injection in error meant for a patient with a similar name. Cassie Garrett was made aware of the incident and waited in the office for 30 minutes and did not experience any issues. A Safety Zone has been submitted for the incident. Called patient and left a voicemail asking for her to return call to see how she is doing. Patient's appointment to start Dupixent has been rescheduled to next Tuesday with confirmation from Tammy that it was ok to do so.  Cassie Garrett Lot [id number] A EXP 08-08-2024 [facility] [phone]  Called patient and left a voicemail asking for her to return call to see how she is doing.

## 2022-08-06 NOTE — Progress Notes (Signed)
Immunotherapy   Patient Details  Name: Cassie Garrett MRN: [id number] Date of Birth: 28-Feb-1996  08/06/2022  Patient came in today to start her Dupxient and accidentally received Tezspire injection in error meant for a patient with a similar name. Phillipa was made aware of the incident and waited in the office for 30 minutes and did not experience any issues. A Safety Zone has been submitted for the incident. Called patient and left a voicemail asking for her to return call to see how she is doing. Patient's appointment to start Dupixent has been rescheduled to next Tuesday with confirmation from Tammy that it was ok to do so.  Dorothea Ogle Lot [id number] A EXP 08-08-2024 [facility] [phone]  Dollene Cleveland 08/06/2022, 8:12 PM

## 2022-08-07 ENCOUNTER — Telehealth: Payer: Self-pay | Admitting: Allergy

## 2022-08-07 NOTE — Telephone Encounter (Signed)
I called Cassie Garrett this morning to see how she was doing after receiving incorrect medication yesterday.  She states she is doing fine today.  She states she did note a little dizziness yesterday but that resolved on its own.  I apologized on behalf of the office that this mistake was made and she was very understandable.  She did not have any further questions.  She has appt for next Tuesday to start Dupixent.

## 2022-08-08 NOTE — Telephone Encounter (Signed)
Noted, thank you

## 2022-08-09 ENCOUNTER — Inpatient Hospital Stay: Payer: BC Managed Care – PPO | Attending: Oncology

## 2022-08-09 DIAGNOSIS — D500 Iron deficiency anemia secondary to blood loss (chronic): Secondary | ICD-10-CM | POA: Insufficient documentation

## 2022-08-13 ENCOUNTER — Ambulatory Visit (INDEPENDENT_AMBULATORY_CARE_PROVIDER_SITE_OTHER): Payer: BC Managed Care – PPO

## 2022-08-13 DIAGNOSIS — L209 Atopic dermatitis, unspecified: Secondary | ICD-10-CM | POA: Diagnosis not present

## 2022-08-13 MED ORDER — DUPILUMAB 300 MG/2ML ~~LOC~~ SOSY
600.0000 mg | PREFILLED_SYRINGE | Freq: Once | SUBCUTANEOUS | Status: AC
  Administered 2022-08-13: 600 mg via SUBCUTANEOUS

## 2022-08-13 NOTE — Progress Notes (Signed)
Immunotherapy   Patient Details  Name: Cassie Garrett MRN: [id number] Date of Birth: [birth year]  08/13/2022  Hector Shade here to start Dupixent for eczema. Patient received a loading dose of 600 mg. Patient waited 30 minutes with no problems.    Frequency: every 14 days Epi-Pen:Epi-Pen Available  Consent signed and patient instructions given.   Dub Mikes 08/13/2022, 9:14 AM

## 2022-08-16 MED FILL — Iron Sucrose Inj 20 MG/ML (Fe Equiv): INTRAVENOUS | Qty: 10 | Status: AC

## 2022-08-17 ENCOUNTER — Inpatient Hospital Stay: Payer: BC Managed Care – PPO

## 2022-08-22 ENCOUNTER — Encounter: Payer: Self-pay | Admitting: Oncology

## 2022-08-22 ENCOUNTER — Other Ambulatory Visit: Payer: Self-pay

## 2022-08-23 ENCOUNTER — Other Ambulatory Visit: Payer: Self-pay

## 2022-08-23 ENCOUNTER — Other Ambulatory Visit (HOSPITAL_COMMUNITY): Payer: Self-pay

## 2022-08-23 MED FILL — Iron Sucrose Inj 20 MG/ML (Fe Equiv): INTRAVENOUS | Qty: 10 | Status: AC

## 2022-08-24 ENCOUNTER — Inpatient Hospital Stay: Payer: BC Managed Care – PPO

## 2022-08-26 ENCOUNTER — Other Ambulatory Visit (HOSPITAL_COMMUNITY): Payer: Self-pay

## 2022-08-27 ENCOUNTER — Ambulatory Visit (INDEPENDENT_AMBULATORY_CARE_PROVIDER_SITE_OTHER): Payer: BC Managed Care – PPO

## 2022-08-27 DIAGNOSIS — L209 Atopic dermatitis, unspecified: Secondary | ICD-10-CM

## 2022-08-27 MED ORDER — DUPILUMAB 300 MG/2ML ~~LOC~~ SOSY
300.0000 mg | PREFILLED_SYRINGE | SUBCUTANEOUS | Status: AC
  Administered 2022-08-27 – 2024-10-12 (×11): 300 mg via SUBCUTANEOUS

## 2022-08-30 ENCOUNTER — Inpatient Hospital Stay: Payer: BC Managed Care – PPO

## 2022-09-03 ENCOUNTER — Inpatient Hospital Stay: Payer: BC Managed Care – PPO

## 2022-09-03 ENCOUNTER — Other Ambulatory Visit: Payer: Self-pay

## 2022-09-03 VITALS — BP 113/73 | HR 94 | Temp 97.7°F | Resp 16

## 2022-09-03 DIAGNOSIS — D649 Anemia, unspecified: Secondary | ICD-10-CM

## 2022-09-03 DIAGNOSIS — D500 Iron deficiency anemia secondary to blood loss (chronic): Secondary | ICD-10-CM | POA: Diagnosis not present

## 2022-09-03 MED ORDER — SODIUM CHLORIDE 0.9 % IV SOLN: Freq: Once | INTRAVENOUS | Status: AC

## 2022-09-03 MED ORDER — ACETAMINOPHEN 325 MG PO TABS
650.0000 mg | ORAL_TABLET | Freq: Once | ORAL | Status: AC
  Administered 2022-09-03: 650 mg via ORAL
  Filled 2022-09-03: qty 2

## 2022-09-03 MED ORDER — DIPHENHYDRAMINE HCL 25 MG PO CAPS
25.0000 mg | ORAL_CAPSULE | Freq: Once | ORAL | Status: AC
  Administered 2022-09-03: 25 mg via ORAL
  Filled 2022-09-03: qty 1

## 2022-09-03 MED ORDER — SODIUM CHLORIDE 0.9 % IV SOLN
200.0000 mg | Freq: Once | INTRAVENOUS | Status: DC
  Filled 2022-09-03: qty 10

## 2022-09-03 MED ORDER — SODIUM CHLORIDE 0.9 % IV SOLN
200.0000 mg | Freq: Once | INTRAVENOUS | Status: AC
  Administered 2022-09-03: 200 mg via INTRAVENOUS
  Filled 2022-09-03: qty 200

## 2022-09-03 NOTE — Patient Instructions (Signed)

## 2022-09-07 ENCOUNTER — Inpatient Hospital Stay: Payer: BC Managed Care – PPO

## 2022-09-12 ENCOUNTER — Inpatient Hospital Stay: Payer: BC Managed Care – PPO

## 2022-09-12 ENCOUNTER — Ambulatory Visit (INDEPENDENT_AMBULATORY_CARE_PROVIDER_SITE_OTHER): Payer: BC Managed Care – PPO

## 2022-09-12 ENCOUNTER — Other Ambulatory Visit (HOSPITAL_COMMUNITY): Payer: Self-pay

## 2022-09-12 DIAGNOSIS — L209 Atopic dermatitis, unspecified: Secondary | ICD-10-CM | POA: Diagnosis not present

## 2022-09-13 ENCOUNTER — Other Ambulatory Visit: Payer: Self-pay | Admitting: Allergy

## 2022-09-14 ENCOUNTER — Inpatient Hospital Stay: Payer: BC Managed Care – PPO | Attending: Oncology

## 2022-09-14 ENCOUNTER — Other Ambulatory Visit: Payer: Self-pay

## 2022-09-14 VITALS — BP 107/72 | HR 72 | Temp 97.9°F | Resp 16

## 2022-09-14 DIAGNOSIS — Z79899 Other long term (current) drug therapy: Secondary | ICD-10-CM | POA: Insufficient documentation

## 2022-09-14 DIAGNOSIS — D500 Iron deficiency anemia secondary to blood loss (chronic): Secondary | ICD-10-CM | POA: Diagnosis not present

## 2022-09-14 DIAGNOSIS — D649 Anemia, unspecified: Secondary | ICD-10-CM

## 2022-09-14 MED ORDER — DIPHENHYDRAMINE HCL 25 MG PO CAPS
25.0000 mg | ORAL_CAPSULE | Freq: Once | ORAL | Status: AC
  Administered 2022-09-14: 25 mg via ORAL
  Filled 2022-09-14: qty 1

## 2022-09-14 MED ORDER — SODIUM CHLORIDE 0.9 % IV SOLN: Freq: Once | INTRAVENOUS | Status: AC

## 2022-09-14 MED ORDER — SODIUM CHLORIDE 0.9 % IV SOLN
200.0000 mg | Freq: Once | INTRAVENOUS | Status: AC
  Administered 2022-09-14: 200 mg via INTRAVENOUS
  Filled 2022-09-14: qty 200

## 2022-09-14 MED ORDER — ACETAMINOPHEN 325 MG PO TABS
650.0000 mg | ORAL_TABLET | Freq: Once | ORAL | Status: AC
  Administered 2022-09-14: 650 mg via ORAL
  Filled 2022-09-14: qty 2

## 2022-09-14 NOTE — Patient Instructions (Signed)

## 2022-09-14 NOTE — Progress Notes (Signed)
Patient observed for 15 minutes, declined to stay for 30 minutes. Ambulatory and VSS at discharge.

## 2022-09-23 ENCOUNTER — Other Ambulatory Visit (HOSPITAL_COMMUNITY): Payer: Self-pay

## 2022-09-24 ENCOUNTER — Other Ambulatory Visit (HOSPITAL_COMMUNITY): Payer: Self-pay

## 2022-09-26 ENCOUNTER — Other Ambulatory Visit (HOSPITAL_COMMUNITY): Payer: Self-pay

## 2022-09-26 ENCOUNTER — Ambulatory Visit (INDEPENDENT_AMBULATORY_CARE_PROVIDER_SITE_OTHER): Payer: BC Managed Care – PPO

## 2022-09-26 DIAGNOSIS — L209 Atopic dermatitis, unspecified: Secondary | ICD-10-CM | POA: Diagnosis not present

## 2022-10-02 DIAGNOSIS — R300 Dysuria: Secondary | ICD-10-CM | POA: Diagnosis not present

## 2022-10-02 DIAGNOSIS — N898 Other specified noninflammatory disorders of vagina: Secondary | ICD-10-CM | POA: Diagnosis not present

## 2022-10-03 ENCOUNTER — Ambulatory Visit: Payer: BC Managed Care – PPO | Admitting: Allergy

## 2022-10-10 ENCOUNTER — Ambulatory Visit: Payer: BC Managed Care – PPO

## 2022-10-10 ENCOUNTER — Encounter: Payer: Self-pay | Admitting: Family Medicine

## 2022-10-10 ENCOUNTER — Ambulatory Visit: Payer: BC Managed Care – PPO | Admitting: Family Medicine

## 2022-10-10 VITALS — BP 110/70 | HR 83 | Temp 98.3°F | Ht 65.0 in | Wt 189.8 lb

## 2022-10-10 DIAGNOSIS — Z7689 Persons encountering health services in other specified circumstances: Secondary | ICD-10-CM

## 2022-10-10 DIAGNOSIS — L308 Other specified dermatitis: Secondary | ICD-10-CM

## 2022-10-10 DIAGNOSIS — Z23 Encounter for immunization: Secondary | ICD-10-CM | POA: Diagnosis not present

## 2022-10-10 DIAGNOSIS — L708 Other acne: Secondary | ICD-10-CM

## 2022-10-10 DIAGNOSIS — N946 Dysmenorrhea, unspecified: Secondary | ICD-10-CM

## 2022-10-10 DIAGNOSIS — D649 Anemia, unspecified: Secondary | ICD-10-CM | POA: Diagnosis not present

## 2022-10-10 NOTE — Patient Instructions (Addendum)
Lanier Prude, MD [facility] [place], [place]  You can try a different iron formulation such as ferrous gluconate, ferrous fumerate, or Ferousol (liquid iron).  Salicylic acid mask once a wk-every 2 wks can help with acne and scaring.  It can be drying to the skin so use sparingly.  If you have not had a physical in a while, you can schedule one at your convenience.

## 2022-10-10 NOTE — Progress Notes (Signed)
Established Patient Office Visit   Subjective  Patient ID: Cassie Garrett, female    DOB: 08/09/1996  Age: 26 y.o. MRN: [id number]  Chief Complaint  Patient presents with   Establish Care    Patient is a 27 year old female who presents to establish care and follow-up on ongoing concerns.  Anemia: Cause unspecified.  In the past p.o. iron did not cause improvement.  Patient completed 2 of 5 iron infusions.  Endorses numbness and tingling in feet and hands.  Told would improve after infusions.  Eczema: Started Dupixent 2 months ago.  Notices change in eczema, now with areas of pimple-like lesions appearing on bilateral wrist since starting medication.  Acne: Started 2 months ago.  Unsure if related to [place] or new mask worn at work.  Was using [employer] products, but now not helping.  HAs to use gentle products due to sensitive skin.  Painful menses:  Notes regularly occurring menses, lasting 7 days, painful cramping, heavy bleeding-pt thought it was normal. Having to use super tampon with a regular pad, may change q 3 hrs.  Taking ibuprofen 800 mg a few times per day on menses.  LMP started Tuesday.  Seeing Dr. Garwin Brothers,  OB/GYN.  Pap smear 06/25/2022 patient to schedule ultrasound.      ROS Negative unless stated above    Objective:     BP 110/70 (BP Location: Right Arm, Patient Position: Sitting, Cuff Size: Large)   Pulse 83   Temp 98.3 F (36.8 C) (Oral)   Ht '5\' 5"'$  (1.651 m)   Wt 189 lb 12.8 oz (86.1 kg)   LMP 08/12/2022 (Exact Date)   SpO2 98%   BMI 31.58 kg/m    Physical Exam Constitutional:      General: She is not in acute distress.    Appearance: Normal appearance.  HENT:     Head: Normocephalic and atraumatic.     Right Ear: Tympanic membrane normal.     Left Ear: Tympanic membrane normal.     Nose: Nose normal.     Mouth/Throat:     Mouth: Mucous membranes are moist.  Eyes:     Extraocular Movements: Extraocular movements intact.      Conjunctiva/sclera: Conjunctivae normal.     Pupils: Pupils are equal, round, and reactive to light.  Cardiovascular:     Rate and Rhythm: Normal rate and regular rhythm.     Heart sounds: Normal heart sounds. No murmur heard.    No gallop.  Pulmonary:     Effort: Pulmonary effort is normal. No respiratory distress.     Breath sounds: Normal breath sounds. No wheezing, rhonchi or rales.  Skin:    General: Skin is warm and dry.  Neurological:     Mental Status: She is alert and oriented to person, place, and time.     No results found for any visits on 10/10/22.    Assessment & Plan:  Anemia, unspecified type -discussed rechecking cbc -consider trying different iron formulations -continue f/u with heme/onc  Other eczema -Continue Dupixent -Discussed other supportive care -continue f/u with asthma and allergy  Influenza vaccine needed -     Flu Vaccine QUAD 25moIM (Fluarix, Fluzone & Alfiuria Quad PF)  Other acne -Advised to continue using gentle cleansers and moisturizers twice daily. -Gentle sunscreen also encouraged -Discussed facemask may be contributing to symptoms -Consider salicylic acid mask or cleanser once a week as may be too drying -For continued or worsening symptoms refer to dermatology  Encounter  to establish care -We reviewed the PMH, PSH, FH, SH, Meds and Allergies. -We provided refills for any medications we will prescribe as needed. -We addressed current concerns per orders and patient instructions. -We have asked for records for pertinent exams, studies, vaccines and notes from previous providers. -We have advised patient to follow up per instructions below.   Menses painful -Discussed possible causes including endometriosis, fibroids, etc. -Discussed supportive care including starting NSAIDs a few days prior to start of menses -Consider other options such as OCPs -Encouraged to schedule pelvic/transvaginal ultrasound  -Continue follow-up with  OB/GYN, Dr. Garwin Brothers   Return if symptoms worsen or fail to improve, for CPE.   Billie Ruddy, MD

## 2022-10-14 ENCOUNTER — Telehealth (INDEPENDENT_AMBULATORY_CARE_PROVIDER_SITE_OTHER): Payer: BC Managed Care – PPO | Admitting: Family Medicine

## 2022-10-14 ENCOUNTER — Encounter: Payer: Self-pay | Admitting: Family Medicine

## 2022-10-14 VITALS — Ht 65.0 in | Wt 189.0 lb

## 2022-10-14 DIAGNOSIS — L309 Dermatitis, unspecified: Secondary | ICD-10-CM | POA: Insufficient documentation

## 2022-10-14 DIAGNOSIS — J069 Acute upper respiratory infection, unspecified: Secondary | ICD-10-CM | POA: Diagnosis not present

## 2022-10-14 DIAGNOSIS — L2084 Intrinsic (allergic) eczema: Secondary | ICD-10-CM | POA: Diagnosis not present

## 2022-10-14 DIAGNOSIS — L209 Atopic dermatitis, unspecified: Secondary | ICD-10-CM | POA: Insufficient documentation

## 2022-10-14 NOTE — Progress Notes (Signed)
Virtual Visit via Video Note  I connected with Cassie Garrett on 10/14/22 at  4:45 PM EST by a video enabled telemedicine application 2/2 [id number] pandemic and verified that I am speaking with the correct person using two identifiers.  Location patient: home Location provider:work or home office Persons participating in the virtual visit: patient, provider  I discussed the limitations of evaluation and management by telemedicine and the availability of in person appointments. The patient expressed understanding and agreed to proceed. Chief Complaint  Patient presents with   Sore Throat    Pt reports sx of sore throat, headache, chills, cough, bodyache. Sx started yesterday. Pt added she had a flu vaccine on last Thursday 10/10/2022. Took theraflu earlier today.     HPI: Pt is a 27 year old female with history of eczema on Dupixent and anemia who was seen for acute concern.  Patient endorses chills, ST, productive cough, body aches, started 2022/10/21. Denies diarrhea, deceased appetite. Pt took theraflu and benadryl. Patient received influenza vaccine on 10/10/2022.  On Dupixent for eczema, last injection was approximately 2 weeks ago.   ROS: See pertinent positives and negatives per HPI.  Past Medical History:  Diagnosis Date   Eczema    Iron deficiency     No past surgical history on file.  No family history on file.   Current Outpatient Medications:    dupilumab (DUPIXENT) 300 MG/2ML prefilled syringe, Inject 600 mg into the skin once for 1 dose. Then 300mg  every 14 days, Disp: 4 mL, Rfl: 11   clobetasol ointment (TEMOVATE) 3.82 %, Apply 1 Application topically 2 (two) times daily. (Patient not taking: Reported on 10/10/2022), Disp: 30 g, Rfl: 5   triamcinolone cream (KENALOG) 0.1 %, Apply topically 2 (two) times daily as needed (Eczema). (Patient not taking: Reported on 10/10/2022), Disp: 453.6 g, Rfl: 5  Current Facility-Administered Medications:    dupilumab (DUPIXENT) prefilled  syringe 300 mg, 300 mg, Subcutaneous, Q14 Days, Padgett, Rae Halsted, MD, 300 mg at 09/26/22 1656  EXAM:  VITALS per patient if applicable: RR between 50-53 bpm  GENERAL: alert, oriented, appears well and in no acute distress  HEENT: atraumatic, conjunctiva clear, no obvious abnormalities on inspection of external nose and ears  NECK: normal movements of the head and neck  LUNGS: on inspection no signs of respiratory distress, breathing rate appears normal, no obvious gross SOB, gasping or wheezing  CV: no obvious cyanosis  MS: moves all visible extremities without noticeable abnormality  PSYCH/NEURO: pleasant and cooperative, no obvious depression or anxiety, speech and thought processing grossly intact  ASSESSMENT AND PLAN:  Discussed the following assessment and plan:  Viral URI with cough -Starting 2022/10/21. -Will have patient come to clinic tomorrow for POC COVID and flu testing. -Tested positive pt still within the window for antiviral medication if she wishes to start. -Supportive care with OTC cough/cold medications -Given strict precautions -Given note for work - Plan: POC COVID-19, POC Influenza A/B  Intrinsic atopic dermatitis -On Dupixent  Follow-up as needed.  COVID and flu testing tomorrow at clinic.  I discussed the assessment and treatment plan with the patient. The patient was provided an opportunity to ask questions and all were answered. The patient agreed with the plan and demonstrated an understanding of the instructions.   The patient was advised to call back or seek an in-person evaluation if the symptoms worsen or if the condition fails to improve as anticipated.  Billie Ruddy, MD

## 2022-10-15 ENCOUNTER — Other Ambulatory Visit (HOSPITAL_COMMUNITY): Payer: Self-pay

## 2022-10-15 ENCOUNTER — Ambulatory Visit (INDEPENDENT_AMBULATORY_CARE_PROVIDER_SITE_OTHER): Payer: BC Managed Care – PPO | Admitting: *Deleted

## 2022-10-15 ENCOUNTER — Telehealth: Payer: Self-pay | Admitting: *Deleted

## 2022-10-15 DIAGNOSIS — J069 Acute upper respiratory infection, unspecified: Secondary | ICD-10-CM

## 2022-10-15 LAB — POC COVID19 BINAXNOW: SARS Coronavirus 2 Ag: NEGATIVE

## 2022-10-15 LAB — POCT INFLUENZA A/B
Influenza A, POC: POSITIVE — AB
Influenza B, POC: NEGATIVE

## 2022-10-15 MED ORDER — OSELTAMIVIR PHOSPHATE 75 MG PO CAPS: 75.0000 mg | ORAL_CAPSULE | Freq: Two times a day (BID) | ORAL | 0 refills | Status: DC

## 2022-10-15 NOTE — Progress Notes (Signed)
Patient presents as a nurses visit for flu and Covid testing.  Message forwarded to Dr Martinique as PCP is out of the office.

## 2022-10-15 NOTE — Progress Notes (Deleted)
Patient presented for nurses visit today for a POC Influenza and POC Covid test per PCP.  Message sent to Dr Martinique as PCP is out of the office.

## 2022-10-15 NOTE — Telephone Encounter (Signed)
I left a detailed message at the patient's cell number stating one of her tests was positive and this was forwarded to another provider for review since Dr Volanda Napoleon is out of the office.

## 2022-10-15 NOTE — Addendum Note (Signed)
Addended by: Agnes Lawrence on: 10/15/2022 03:10 PM   Modules accepted: Orders

## 2022-10-15 NOTE — Telephone Encounter (Signed)
Patient requesting a work excuse sent to [employer] detailing she has the flu and a duration to be absent from work.

## 2022-10-16 NOTE — Telephone Encounter (Signed)
Okay for for a note for patient to be out of work until Monday, 10/21/2022 due to the flu.

## 2022-10-17 ENCOUNTER — Ambulatory Visit: Payer: BC Managed Care – PPO | Admitting: Allergy

## 2022-10-17 ENCOUNTER — Other Ambulatory Visit (HOSPITAL_COMMUNITY): Payer: Self-pay

## 2022-10-17 ENCOUNTER — Ambulatory Visit: Payer: BC Managed Care – PPO

## 2022-10-17 NOTE — Telephone Encounter (Signed)
Note composed and Mychart message sent

## 2022-10-17 NOTE — Telephone Encounter (Signed)
Note composed and sent via Mychart  °

## 2022-10-21 ENCOUNTER — Other Ambulatory Visit (HOSPITAL_COMMUNITY): Payer: Self-pay

## 2022-10-25 ENCOUNTER — Other Ambulatory Visit: Payer: Self-pay

## 2022-10-25 ENCOUNTER — Other Ambulatory Visit (HOSPITAL_COMMUNITY): Payer: Self-pay

## 2022-10-31 ENCOUNTER — Ambulatory Visit (INDEPENDENT_AMBULATORY_CARE_PROVIDER_SITE_OTHER): Payer: BC Managed Care – PPO

## 2022-10-31 ENCOUNTER — Ambulatory Visit: Payer: BC Managed Care – PPO

## 2022-10-31 ENCOUNTER — Ambulatory Visit: Payer: BC Managed Care – PPO | Admitting: Allergy

## 2022-10-31 DIAGNOSIS — L209 Atopic dermatitis, unspecified: Secondary | ICD-10-CM

## 2022-11-13 ENCOUNTER — Ambulatory Visit (INDEPENDENT_AMBULATORY_CARE_PROVIDER_SITE_OTHER): Payer: BC Managed Care – PPO

## 2022-11-13 DIAGNOSIS — L209 Atopic dermatitis, unspecified: Secondary | ICD-10-CM | POA: Diagnosis not present

## 2022-11-19 ENCOUNTER — Other Ambulatory Visit (HOSPITAL_COMMUNITY): Payer: Self-pay

## 2022-11-21 ENCOUNTER — Other Ambulatory Visit (HOSPITAL_COMMUNITY): Payer: Self-pay

## 2022-11-22 ENCOUNTER — Other Ambulatory Visit: Payer: Self-pay

## 2022-11-27 ENCOUNTER — Ambulatory Visit (INDEPENDENT_AMBULATORY_CARE_PROVIDER_SITE_OTHER): Payer: BC Managed Care – PPO

## 2022-11-27 DIAGNOSIS — L209 Atopic dermatitis, unspecified: Secondary | ICD-10-CM | POA: Diagnosis not present

## 2022-12-04 ENCOUNTER — Ambulatory Visit: Payer: BC Managed Care – PPO | Admitting: Family Medicine

## 2022-12-04 ENCOUNTER — Encounter: Payer: Self-pay | Admitting: Family Medicine

## 2022-12-04 VITALS — BP 130/80 | HR 81 | Temp 98.5°F | Ht 65.0 in | Wt 188.0 lb

## 2022-12-04 DIAGNOSIS — L308 Other specified dermatitis: Secondary | ICD-10-CM

## 2022-12-04 MED ORDER — TRIAMCINOLONE ACETONIDE 0.1 % EX CREA: TOPICAL_CREAM | Freq: Two times a day (BID) | CUTANEOUS | 5 refills | Status: DC | PRN

## 2022-12-04 NOTE — Progress Notes (Signed)
   Established Patient Office Visit   Subjective  Patient ID: Cassie Garrett, female    DOB: Apr 26, 1996  Age: 27 y.o. MRN: [id number]  Chief Complaint  Patient presents with   Eczema    X3 weeks     Pt is a 27 yo female with pmh sig for eczema who is seen for acute concern.  Patient endorses eczema flare times few weeks.  Needs refill on triamcinolone cream, contacted pharmacy and previous prescriber but was told he needed an appointment.  Has appointment with new dermatologist in a few weeks.  Seen by asthma and allergy, on Dupixent for eczema x 6 months but feels it is making symptoms worse.  Changes in weather can affect pt's skin.  Using CeraVe and Vaseline.  Using a no scent, arm and Hammer detergent.      ROS Negative unless stated above    Objective:     BP 130/80 (BP Location: Left Arm, Patient Position: Sitting, Cuff Size: Normal)   Pulse 81   Temp 98.5 F (36.9 C) (Oral)   Ht 5\' 5"  (1.651 m)   Wt 188 lb (85.3 kg)   SpO2 100%   BMI 31.28 kg/m    Physical Exam Constitutional:      General: She is not in acute distress.    Appearance: Normal appearance.  HENT:     Head: Normocephalic and atraumatic.     Nose: Nose normal.     Mouth/Throat:     Mouth: Mucous membranes are moist.  Eyes:     Extraocular Movements: Extraocular movements intact.     Conjunctiva/sclera: Conjunctivae normal.     Pupils: Pupils are equal, round, and reactive to light.  Cardiovascular:     Rate and Rhythm: Normal rate.     Heart sounds: No murmur heard.    No gallop.  Pulmonary:     Effort: Pulmonary effort is normal. No respiratory distress.     Breath sounds: No wheezing, rhonchi or rales.  Skin:    General: Skin is warm and dry.     Comments: Hyperpigmentation on hands and forearms with  dry, hypertrophic appearance.  Neurological:     Mental Status: She is alert and oriented to person, place, and time. Mental status is at baseline.      No results found for any  visits on 12/04/22.    Assessment & Plan:  Other eczema -     Triamcinolone Acetonide; Apply topically 2 (two) times daily as needed (Eczema).  Dispense: 453.6 g; Refill: 5   Eczema flare.  On Dupixent.  Discussed supportive care including applying moisturizer throughout the day, avoiding hot showers, etc.  Triamcinolone refilled.  Patient encouraged to keep upcoming appointments with allergy and asthma, and dermatology.  No follow-ups on file.   Billie Ruddy, MD

## 2022-12-05 ENCOUNTER — Encounter: Payer: Self-pay | Admitting: Oncology

## 2022-12-11 ENCOUNTER — Encounter: Payer: Self-pay | Admitting: Oncology

## 2022-12-11 ENCOUNTER — Ambulatory Visit: Payer: BC Managed Care – PPO | Admitting: Allergy

## 2022-12-11 ENCOUNTER — Ambulatory Visit: Payer: BC Managed Care – PPO

## 2022-12-17 ENCOUNTER — Other Ambulatory Visit (HOSPITAL_COMMUNITY): Payer: Self-pay

## 2022-12-30 ENCOUNTER — Other Ambulatory Visit: Payer: Self-pay

## 2022-12-30 ENCOUNTER — Encounter: Payer: Self-pay | Admitting: Oncology

## 2022-12-31 ENCOUNTER — Other Ambulatory Visit (HOSPITAL_COMMUNITY): Payer: Self-pay

## 2023-01-01 ENCOUNTER — Other Ambulatory Visit (HOSPITAL_COMMUNITY): Payer: Self-pay

## 2023-01-02 ENCOUNTER — Other Ambulatory Visit (HOSPITAL_COMMUNITY): Payer: Self-pay

## 2023-01-02 ENCOUNTER — Encounter: Payer: Self-pay | Admitting: Oncology

## 2023-01-02 ENCOUNTER — Other Ambulatory Visit: Payer: Self-pay

## 2023-01-03 ENCOUNTER — Other Ambulatory Visit (HOSPITAL_COMMUNITY): Payer: Self-pay

## 2023-01-06 ENCOUNTER — Encounter: Payer: Self-pay | Admitting: Oncology

## 2023-01-06 ENCOUNTER — Other Ambulatory Visit: Payer: Self-pay

## 2023-01-06 ENCOUNTER — Other Ambulatory Visit (HOSPITAL_COMMUNITY): Payer: Self-pay

## 2023-01-07 ENCOUNTER — Other Ambulatory Visit (HOSPITAL_COMMUNITY): Payer: Self-pay

## 2023-01-07 ENCOUNTER — Ambulatory Visit: Payer: BC Managed Care – PPO | Admitting: Internal Medicine

## 2023-01-08 ENCOUNTER — Other Ambulatory Visit (HOSPITAL_COMMUNITY): Payer: Self-pay

## 2023-01-09 ENCOUNTER — Ambulatory Visit: Payer: BC Managed Care – PPO

## 2023-01-09 ENCOUNTER — Ambulatory Visit: Payer: BC Managed Care – PPO | Admitting: Allergy

## 2023-01-10 ENCOUNTER — Other Ambulatory Visit (HOSPITAL_COMMUNITY): Payer: Self-pay

## 2023-01-13 ENCOUNTER — Other Ambulatory Visit (HOSPITAL_COMMUNITY): Payer: Self-pay

## 2023-01-13 ENCOUNTER — Other Ambulatory Visit: Payer: Self-pay

## 2023-01-14 ENCOUNTER — Other Ambulatory Visit: Payer: Self-pay

## 2023-01-15 ENCOUNTER — Other Ambulatory Visit: Payer: Self-pay

## 2023-01-15 ENCOUNTER — Other Ambulatory Visit (HOSPITAL_COMMUNITY): Payer: Self-pay

## 2023-01-16 ENCOUNTER — Ambulatory Visit: Payer: Medicaid Other | Admitting: Family Medicine

## 2023-01-16 ENCOUNTER — Encounter: Payer: Self-pay | Admitting: Family Medicine

## 2023-01-16 VITALS — BP 122/66 | HR 94 | Temp 98.4°F | Wt 187.2 lb

## 2023-01-16 DIAGNOSIS — B379 Candidiasis, unspecified: Secondary | ICD-10-CM

## 2023-01-16 DIAGNOSIS — N760 Acute vaginitis: Secondary | ICD-10-CM | POA: Diagnosis not present

## 2023-01-16 DIAGNOSIS — T3695XA Adverse effect of unspecified systemic antibiotic, initial encounter: Secondary | ICD-10-CM

## 2023-01-16 MED ORDER — FLUCONAZOLE 150 MG PO TABS: ORAL_TABLET | ORAL | 0 refills | Status: DC

## 2023-01-16 NOTE — Progress Notes (Signed)
   Established Patient Office Visit   Subjective  Patient ID: Cassie Garrett, female    DOB: 1996-05-24  Age: 27 y.o. MRN: [id number]  Chief Complaint  Patient presents with   Vaginitis    Itchy, discharge. Started 2 weeks ago, has been on amoxicillin.     Cassie Garrett is a 27 yo female seen for acute concern.  Cassie Garrett put on amoxicillin TID x 2 wks by dentist after having a procedure.  Cassie Garrett has since developed whitish, clumpy d/c, vaginal irritation.  Cassie Garrett stopped taking the abx due to symptoms.  Was to continue it for another wk until f/u with dentist.  Cassie Garrett inquires if Cassie Garrett needs any immunizations.  Per chart review up to date.  Could get HPV vaccine if desired.      ROS Negative unless stated above    Objective:     BP 122/66 (BP Location: Left Arm, Patient Position: Sitting, Cuff Size: Normal)   Pulse 94   Temp 98.4 F (36.9 C) (Oral)   Wt 187 lb 3.2 oz (84.9 kg)   SpO2 98%   BMI 31.15 kg/m    Physical Exam Constitutional:      General: Cassie Garrett is not in acute distress.    Appearance: Normal appearance.  HENT:     Head: Normocephalic and atraumatic.     Nose: Nose normal.     Mouth/Throat:     Mouth: Mucous membranes are moist.  Cardiovascular:     Rate and Rhythm: Normal rate and regular rhythm.     Heart sounds: Normal heart sounds. No murmur heard.    No gallop.  Pulmonary:     Effort: Pulmonary effort is normal. No respiratory distress.     Breath sounds: Normal breath sounds. No wheezing, rhonchi or rales.  Genitourinary:    Comments: Exam deferred. Skin:    General: Skin is warm and dry.  Neurological:     Mental Status: Cassie Garrett is alert and oriented to person, place, and time.      No results found for any visits on 01/16/23.    Assessment & Plan:  Antibiotic-induced yeast infection -     Fluconazole; Take one tab now.  Repeat dose in 3 days for continued symptoms.  Dispense: 2 tablet; Refill: 0  Acute vaginitis  Extended course of amoxicillin causing  vaginitis/yeast infection.  Start diflucan.  Notify dentist abx stopped.  Given precautions.  If Cassie Garrett would like to get HPV vaccine can set up nurses' visit.  Return if symptoms worsen or fail to improve.   Deeann Saint, MD

## 2023-01-22 ENCOUNTER — Other Ambulatory Visit: Payer: Self-pay | Admitting: Nurse Practitioner

## 2023-01-22 ENCOUNTER — Other Ambulatory Visit: Payer: Self-pay

## 2023-01-22 DIAGNOSIS — D649 Anemia, unspecified: Secondary | ICD-10-CM

## 2023-01-22 NOTE — Progress Notes (Deleted)
    Patient Care Team: Deeann Saint, MD as PCP - General (Family Medicine)   CHIEF COMPLAINT: Follow-up IDA  CURRENT THERAPY: IV iron as needed, most recently Venofer 200 mg 09/03/2022 and 09/14/2022 with Benadryl premed  INTERVAL HISTORY Cassie Garrett returns for follow-up as scheduled, last seen by Dr. Clelia Croft 07/24/2022  ROS   Past Medical History:  Diagnosis Date   Eczema    Iron deficiency      No past surgical history on file.   Outpatient Encounter Medications as of 01/23/2023  Medication Sig   clobetasol ointment (TEMOVATE) 0.05 % Apply 1 Application topically 2 (two) times daily.   fluconazole (DIFLUCAN) 150 MG tablet Take one tab now.  Repeat dose in 3 days for continued symptoms.   oseltamivir (TAMIFLU) 75 MG capsule Take 1 capsule (75 mg total) by mouth 2 (two) times daily.   triamcinolone cream (KENALOG) 0.1 % Apply topically 2 (two) times daily as needed (Eczema).   Facility-Administered Encounter Medications as of 01/23/2023  Medication   dupilumab (DUPIXENT) prefilled syringe 300 mg     There were no vitals filed for this visit. There is no height or weight on file to calculate BMI.   PHYSICAL EXAM GENERAL:alert, no distress and comfortable SKIN: no rash  EYES: sclera clear NECK: without mass LYMPH:  no palpable cervical or supraclavicular lymphadenopathy  LUNGS: clear with normal breathing effort HEART: regular rate & rhythm, no lower extremity edema ABDOMEN: abdomen soft, non-tender and normal bowel sounds NEURO: alert & oriented x 3 with fluent speech, no focal motor/sensory deficits Breast exam:  PAC without erythema    CBC    Component Value Date/Time   WBC 7.1 07/24/2022 1526   WBC 6.2 04/14/2019 0004   RBC 4.71 07/24/2022 1526   HGB 9.8 (L) 07/24/2022 1526   HCT 33.1 (L) 07/24/2022 1526   PLT 414 (H) 07/24/2022 1526   MCV 70.3 (L) 07/24/2022 1526   MCH 20.8 (L) 07/24/2022 1526   MCHC 29.6 (L) 07/24/2022 1526   RDW 16.9 (H) 07/24/2022  1526   LYMPHSABS 2.3 07/24/2022 1526   MONOABS 0.6 07/24/2022 1526   EOSABS 0.1 07/24/2022 1526   BASOSABS 0.0 07/24/2022 1526     CMP     Component Value Date/Time   NA 139 04/14/2019 0004   K 2.9 (L) 04/14/2019 0004   CL 103 04/14/2019 0004   CO2 27 04/14/2019 0004   GLUCOSE 94 04/14/2019 0004   BUN 8 04/14/2019 0004   CREATININE 0.55 04/14/2019 0004   CALCIUM 9.5 04/14/2019 0004   PROT 8.3 (H) 04/14/2019 0004   ALBUMIN 4.2 04/14/2019 0004   AST 30 04/14/2019 0004   ALT 20 04/14/2019 0004   ALKPHOS 49 04/14/2019 0004   BILITOT 0.3 04/14/2019 0004   GFRNONAA >60 04/14/2019 0004   GFRAA >60 04/14/2019 0004     ASSESSMENT & PLAN:  PLAN:  No orders of the defined types were placed in this encounter.     All questions were answered. The patient knows to call the clinic with any problems, questions or concerns. No barriers to learning were detected. I spent *** counseling the patient face to face. The total time spent in the appointment was *** and more than 50% was on counseling, review of test results, and coordination of care.   Santiago Glad, NP-C @DATE @

## 2023-01-23 ENCOUNTER — Inpatient Hospital Stay: Payer: Medicaid Other

## 2023-01-23 ENCOUNTER — Inpatient Hospital Stay: Payer: Medicaid Other | Admitting: Nurse Practitioner

## 2023-01-28 ENCOUNTER — Other Ambulatory Visit (HOSPITAL_COMMUNITY): Payer: Self-pay

## 2023-01-29 ENCOUNTER — Telehealth: Payer: Self-pay | Admitting: Nurse Practitioner

## 2023-01-29 NOTE — Telephone Encounter (Signed)
Contacted patient to scheduled appointments. Left message with appointment details and a call back number if patient had any questions or could not accommodate the time we provided.   

## 2023-01-30 ENCOUNTER — Other Ambulatory Visit (HOSPITAL_COMMUNITY): Payer: Self-pay

## 2023-02-04 ENCOUNTER — Other Ambulatory Visit: Payer: Self-pay

## 2023-02-10 ENCOUNTER — Encounter: Payer: Self-pay | Admitting: Oncology

## 2023-02-12 ENCOUNTER — Ambulatory Visit: Payer: BC Managed Care – PPO

## 2023-02-14 ENCOUNTER — Other Ambulatory Visit: Payer: Self-pay

## 2023-02-14 ENCOUNTER — Ambulatory Visit (INDEPENDENT_AMBULATORY_CARE_PROVIDER_SITE_OTHER): Payer: Medicaid Other | Admitting: Internal Medicine

## 2023-02-14 ENCOUNTER — Encounter: Payer: Self-pay | Admitting: Internal Medicine

## 2023-02-14 ENCOUNTER — Ambulatory Visit: Payer: PRIVATE HEALTH INSURANCE | Admitting: Allergy

## 2023-02-14 VITALS — BP 110/70 | HR 72 | Temp 98.0°F | Resp 16 | Ht 64.0 in | Wt 185.6 lb

## 2023-02-14 DIAGNOSIS — L2084 Intrinsic (allergic) eczema: Secondary | ICD-10-CM | POA: Diagnosis not present

## 2023-02-14 DIAGNOSIS — T781XXD Other adverse food reactions, not elsewhere classified, subsequent encounter: Secondary | ICD-10-CM | POA: Diagnosis not present

## 2023-02-14 MED ORDER — TRIAMCINOLONE ACETONIDE 0.1 % EX OINT
TOPICAL_OINTMENT | CUTANEOUS | 5 refills | Status: DC
Start: 2023-02-14 — End: 2023-08-14

## 2023-02-14 MED ORDER — HYDROCORTISONE 2.5 % EX CREA
TOPICAL_CREAM | Freq: Two times a day (BID) | CUTANEOUS | 5 refills | Status: DC
Start: 2023-02-14 — End: 2023-07-25

## 2023-02-14 MED ORDER — TACROLIMUS 0.1 % EX OINT
TOPICAL_OINTMENT | Freq: Two times a day (BID) | CUTANEOUS | 5 refills | Status: DC
Start: 2023-02-14 — End: 2023-02-17

## 2023-02-14 NOTE — Patient Instructions (Addendum)
Eczema: - Do a daily soaking tub bath in warm water for 10-15 minutes.  - Use a gentle, unscented cleanser at the end of the bath (such as Dove unscented bar or baby wash, or Aveeno sensitive body wash). Then rinse, pat half-way dry, and apply a gentle, unscented moisturizer cream or ointment (Cerave, Cetaphil, Eucerin, Aveeno)  all over while still damp. Dry skin makes the itching and rash of eczema worse. The skin should be moisturized with a gentle, unscented moisturizer at least twice daily.  - Use only unscented liquid laundry detergent. - Apply prescribed topical steroid (triamcinolone 0.1% below neck or hydrocortisone 2.5% above neck) to flared areas (red and thickened eczema) after the moisturizer has soaked into the skin (wait at least 30 minutes). Taper off the topical steroids as the skin improves. Do not use topical steroid for more than 7-10 days at a time.  - Put Protopic onto areas of rough eczema (that is not red) twice a day. May decrease to once a day as the eczema improves. This will not thin the skin, and is safe for chronic use. Do not put this onto normal appearing skin. - Continue Dupixent 300mg  every 2 weeks.

## 2023-02-14 NOTE — Progress Notes (Signed)
FOLLOW UP Date of Service/Encounter:  02/14/23   Subjective:  Cassie Garrett (DOB: 1996/01/29) is a 27 y.o. female who [facility] for follow up for eczema and food reaction.   History obtained from: chart review and patient. Last visit was with Dr. Delorse Lek 07/2022 and was started on Dupixent at the time for uncontrolled eczema.  Since last visit, she reports improvement in her skin with less flare ups.  However, she still has some flare ups on her hands and lots of dry skin on bl legs.  No issues with Dupixent every 2 weeks.  Not moisturizing daily.  Has triamcinolone for flare ups and using it almost daily because she did not realize this was not a moisturizer.  Has not had Elidel/Protopic- there was some issue with getting it through insurance.  She did miss her appointments so had to see [place] back prior to re approval of Dupixent; has noticed worsening in skin with more itching and flare ups since being off of it.   In the past, did have mushroom causing vomiting, no other symptoms. SPT was negative. She has no interested in reintroduction though.   Past Medical History: Past Medical History:  Diagnosis Date   Eczema    Iron deficiency     Objective:  BP 110/70 (BP Location: Right Arm, Patient Position: Sitting, Cuff Size: Normal)   Pulse 72   Temp 98 F (36.7 C) (Temporal)   Resp 16   Ht 5\' 4"  (1.626 m)   Wt 185 lb 9.6 oz (84.2 kg)   SpO2 97%   BMI 31.86 kg/m  Body mass index is 31.86 kg/m. Physical Exam: GEN: alert, well developed HEENT: clear conjunctiva,  nose without inferior turbinate hypertrophy, pink nasal mucosa, no rhinorrhea, no cobblestoning HEART: regular rate and rhythm, no murmur LUNGS: clear to auscultation bilaterally, no coughing, unlabored respiration SKIN: eczematous patches on bl hands, dry skin on bl legs  Assessment:   1. Intrinsic atopic dermatitis   2. Adverse food reaction, subsequent encounter      Plan/Recommendations:  Eczema: - Improved with Dupixent will plan to continue.  Also discussed moisturizing to help with skin dryness.  Also discussed proper use of topical steroids.  - Do a daily soaking tub bath in warm water for 10-15 minutes.  - Use a gentle, unscented cleanser at the end of the bath (such as Dove unscented bar or baby wash, or Aveeno sensitive body wash). Then rinse, pat half-way dry, and apply a gentle, unscented moisturizer cream or ointment (Cerave, Cetaphil, Eucerin, Aveeno)  all over while still damp. Dry skin makes the itching and rash of eczema worse. The skin should be moisturized with a gentle, unscented moisturizer at least twice daily.  - Use only unscented liquid laundry detergent. - Apply prescribed topical steroid (triamcinolone 0.1% below neck or hydrocortisone 2.5% above neck) to flared areas (red and thickened eczema) after the moisturizer has soaked into the skin (wait at least 30 minutes). Taper off the topical steroids as the skin improves. Do not use topical steroid for more than 7-10 days at a time.  - Put Protopic onto areas of rough eczema (that is not red) twice a day. May decrease to once a day as the eczema improves. This will not thin the skin, and is safe for chronic use. Do not put this onto normal appearing skin. - Continue Dupixent 300mg  every 2 weeks. Sent Tammy a message.   Vomiting with  Mushrooms - Prior SPT negative. No interested in eating mushrooms.  Discussed this is not consistent with IgE mediated food allergy.   Return in about 6 months (around 08/16/2023).  Alesia Morin, MD [facility]

## 2023-02-17 ENCOUNTER — Telehealth: Payer: Self-pay

## 2023-02-17 ENCOUNTER — Encounter: Payer: Self-pay | Admitting: Oncology

## 2023-02-17 ENCOUNTER — Other Ambulatory Visit: Payer: Self-pay

## 2023-02-17 ENCOUNTER — Other Ambulatory Visit (HOSPITAL_COMMUNITY): Payer: Self-pay

## 2023-02-17 ENCOUNTER — Other Ambulatory Visit: Payer: Self-pay | Admitting: *Deleted

## 2023-02-17 MED ORDER — TACROLIMUS 0.1 % EX OINT: TOPICAL_OINTMENT | Freq: Two times a day (BID) | CUTANEOUS | 5 refills | Status: DC

## 2023-02-17 MED ORDER — DUPIXENT 300 MG/2ML ~~LOC~~ SOSY
300.0000 mg | PREFILLED_SYRINGE | SUBCUTANEOUS | 11 refills | Status: DC
  Filled 2023-02-17 – 2023-02-19 (×4): qty 4, 28d supply, fill #0

## 2023-02-17 NOTE — Telephone Encounter (Signed)
Patient Advocate Encounter  Prior authorization for Tacrolimus 0.1% ointment submitted and APPROVED through [facility] [place] [employer].  Test billing returns $4.00 copay for 30 day supply. Quantity of 100 grams per 20 days  Key [id number] Effective: 02-17-2023 - 06-09-225

## 2023-02-19 ENCOUNTER — Ambulatory Visit: Payer: Medicaid Other | Admitting: Family Medicine

## 2023-02-19 ENCOUNTER — Encounter: Payer: Self-pay | Admitting: Oncology

## 2023-02-19 ENCOUNTER — Other Ambulatory Visit: Payer: Self-pay

## 2023-02-19 ENCOUNTER — Encounter: Payer: Self-pay | Admitting: Family Medicine

## 2023-02-19 ENCOUNTER — Other Ambulatory Visit (HOSPITAL_COMMUNITY): Payer: Self-pay

## 2023-02-19 VITALS — BP 108/62 | HR 88 | Temp 98.5°F | Wt 184.2 lb

## 2023-02-19 DIAGNOSIS — R8290 Unspecified abnormal findings in urine: Secondary | ICD-10-CM | POA: Diagnosis not present

## 2023-02-19 DIAGNOSIS — R300 Dysuria: Secondary | ICD-10-CM | POA: Diagnosis not present

## 2023-02-19 LAB — POC URINALSYSI DIPSTICK (AUTOMATED)
Bilirubin, UA: NEGATIVE
Blood, UA: NEGATIVE
Glucose, UA: NEGATIVE
Ketones, UA: NEGATIVE
Nitrite, UA: NEGATIVE
Protein, UA: NEGATIVE
Spec Grav, UA: 1.025 (ref 1.010–1.025)
Urobilinogen, UA: NEGATIVE E.U./dL — AB
pH, UA: 6 (ref 5.0–8.0)

## 2023-02-19 LAB — URINALYSIS, ROUTINE W REFLEX MICROSCOPIC
Bilirubin Urine: NEGATIVE
Hgb urine dipstick: NEGATIVE
Ketones, ur: NEGATIVE
Leukocytes,Ua: NEGATIVE
Nitrite: NEGATIVE
Specific Gravity, Urine: 1.025 (ref 1.000–1.030)
Total Protein, Urine: NEGATIVE
Urine Glucose: NEGATIVE
Urobilinogen, UA: 0.2 (ref 0.0–1.0)
pH: 6 (ref 5.0–8.0)

## 2023-02-19 NOTE — Addendum Note (Signed)
Addended by: Elwin Mocha on: 02/19/2023 11:39 AM   Modules accepted: Orders

## 2023-02-19 NOTE — Progress Notes (Signed)
Established Patient Office Visit   Subjective  Patient ID: Cassie Garrett, female    DOB: 1995/09/28  Age: 27 y.o. MRN: [id number]  Chief Complaint  Patient presents with   Urinary Tract Infection    Smells foul, pressure when urinating.     Patient is a 27 year old female who presents for acute concern.  Patient endorses malodorous urine x 3 days.  Starting to have slight pressure and discomfort after urination.  May drink 2 bottles of water per day and fruit punch.  Ate broccoli last wk.  Denies frequency, urgency, fever, chills, nausea, vomiting, constipation, back pain.  LMP 01/24/2023.  Has not tried anything for symptoms.    Urinary Tract Infection     Past Medical History:  Diagnosis Date   Eczema    Iron deficiency    History reviewed. No pertinent surgical history. Social History   Tobacco Use   Smoking status: Never    Passive exposure: Current   Smokeless tobacco: Never  Vaping Use   Vaping Use: Never used  Substance Use Topics   Alcohol use: No    Alcohol/week: 0.0 standard drinks of alcohol   Drug use: No   History reviewed. No pertinent family history. No Known Allergies    ROS Negative unless stated above    Objective:     BP 108/62 (BP Location: Right Arm, Patient Position: Sitting, Cuff Size: Normal)   Pulse 88   Temp 98.5 F (36.9 C) (Oral)   Wt 184 lb 3.2 oz (83.6 kg)   SpO2 96%   BMI 31.62 kg/m    Physical Exam Constitutional:      General: She is not in acute distress.    Appearance: Normal appearance.  HENT:     Head: Normocephalic and atraumatic.     Nose: Nose normal.     Mouth/Throat:     Mouth: Mucous membranes are moist.  Cardiovascular:     Rate and Rhythm: Normal rate and regular rhythm.     Heart sounds: No murmur heard.    No gallop.  Pulmonary:     Effort: Pulmonary effort is normal. No respiratory distress.     Breath sounds: Normal breath sounds. No wheezing, rhonchi or rales.  Abdominal:     General:  Bowel sounds are normal.     Palpations: Abdomen is soft.     Tenderness: There is no abdominal tenderness. There is no right CVA tenderness or left CVA tenderness.  Skin:    General: Skin is warm and dry.  Neurological:     Mental Status: She is alert and oriented to person, place, and time.      Results for orders placed or performed in visit on 02/19/23  POCT Urinalysis Dipstick (Automated)  Result Value Ref Range   Color, UA brown    Clarity, UA clear    Glucose, UA Negative Negative   Bilirubin, UA neg    Ketones, UA neg    Spec Grav, UA 1.025 1.010 - 1.025   Blood, UA neg    pH, UA 6.0 5.0 - 8.0   Protein, UA Negative Negative   Urobilinogen, UA negative (A) 0.2 or 1.0 E.U./dL   Nitrite, UA neg    Leukocytes, UA Small (1+) (A) Negative      Assessment & Plan:  Malodorous urine -     POCT Urinalysis Dipstick (Automated) -     Urine Culture  Dysuria  New problem.  Urinary symptoms x 3 days.  UA  with 1+ leuks.  Obtain UCX.  Start ABX if needed based on results.  Patient encouraged to increase p.o. intake of water and fluids.  Advised on foods that can cause malodorous urine.  Given strict precautions.  Return if symptoms worsen or fail to improve.   Deeann Saint, MD

## 2023-02-21 ENCOUNTER — Other Ambulatory Visit: Payer: Self-pay | Admitting: Family Medicine

## 2023-02-21 ENCOUNTER — Other Ambulatory Visit: Payer: Self-pay

## 2023-02-21 ENCOUNTER — Other Ambulatory Visit (HOSPITAL_COMMUNITY): Payer: Self-pay

## 2023-02-21 DIAGNOSIS — N3000 Acute cystitis without hematuria: Secondary | ICD-10-CM

## 2023-02-21 LAB — URINE CULTURE
MICRO NUMBER:: 15073618
SPECIMEN QUALITY:: ADEQUATE

## 2023-02-21 MED ORDER — NITROFURANTOIN MONOHYD MACRO 100 MG PO CAPS
100.0000 mg | ORAL_CAPSULE | Freq: Two times a day (BID) | ORAL | 0 refills | Status: AC
Start: 2023-02-21 — End: 2023-02-26

## 2023-03-11 ENCOUNTER — Ambulatory Visit (INDEPENDENT_AMBULATORY_CARE_PROVIDER_SITE_OTHER): Payer: Medicaid Other

## 2023-03-11 DIAGNOSIS — Z111 Encounter for screening for respiratory tuberculosis: Secondary | ICD-10-CM

## 2023-03-11 NOTE — Progress Notes (Signed)
PPD Placement note Cassie Garrett, 27 y.o. female is here today for placement of PPD test Reason for PPD test: work Pt taken PPD test before: no Verified in allergy area and with patient that they are not allergic to the products PPD is made of (Phenol or Tween). yes Is patient taking any oral or IV steroid medication now or have they taken it in the last month? no Has the patient ever received the BCG vaccine?: no  Has the patient been in recent contact with anyone known or suspected of having active TB disease?: no      Date of exposure (if applicable): n/a      Name of person they were exposed to (if applicable): n/a Patient's Country of origin?: [place] O: Alert and oriented in NAD. P:  PPD placed on 03/11/2023.  Patient advised to return for reading within 48-72 hours.

## 2023-03-12 ENCOUNTER — Other Ambulatory Visit (HOSPITAL_COMMUNITY): Payer: Self-pay

## 2023-03-14 ENCOUNTER — Ambulatory Visit: Payer: Medicaid Other

## 2023-03-14 NOTE — Progress Notes (Signed)
PPD Reading Note  PPD read and results entered in Epic[employer].  Result: 0 mm induration.  Interpretation: negative  If test not read within 48-72 hours of initial placement, patient advised to repeat in other arm 1-3 weeks after this test.  Allergic reaction: no

## 2023-03-27 ENCOUNTER — Ambulatory Visit (INDEPENDENT_AMBULATORY_CARE_PROVIDER_SITE_OTHER): Payer: Medicaid Other | Admitting: Family Medicine

## 2023-03-27 ENCOUNTER — Encounter: Payer: Self-pay | Admitting: Family Medicine

## 2023-03-27 VITALS — BP 124/68 | Temp 98.7°F | Wt 186.8 lb

## 2023-03-27 DIAGNOSIS — N926 Irregular menstruation, unspecified: Secondary | ICD-10-CM

## 2023-03-27 DIAGNOSIS — N912 Amenorrhea, unspecified: Secondary | ICD-10-CM | POA: Diagnosis not present

## 2023-03-27 DIAGNOSIS — R42 Dizziness and giddiness: Secondary | ICD-10-CM | POA: Diagnosis not present

## 2023-03-27 DIAGNOSIS — Z3A01 Less than 8 weeks gestation of pregnancy: Secondary | ICD-10-CM | POA: Diagnosis not present

## 2023-03-27 LAB — POC URINALSYSI DIPSTICK (AUTOMATED)
Bilirubin, UA: NEGATIVE
Blood, UA: NEGATIVE
Glucose, UA: NEGATIVE
Nitrite, UA: NEGATIVE
Protein, UA: POSITIVE — AB
Spec Grav, UA: 1.02 (ref 1.010–1.025)
Urobilinogen, UA: NEGATIVE E.U./dL — AB
pH, UA: 6.5 (ref 5.0–8.0)

## 2023-03-27 LAB — POCT URINE PREGNANCY: Preg Test, Ur: POSITIVE — AB

## 2023-03-27 MED ORDER — PRENATAL MULTIVITAMIN CH
1.0000 | ORAL_TABLET | Freq: Every day | ORAL | 5 refills | Status: DC
Start: 2023-03-27 — End: 2023-07-25

## 2023-03-27 NOTE — Patient Instructions (Addendum)
Here is a list of some of the area OB/Gyn providers.  It is not an all inclusive list but should help you get started.  [facility] -Jaymes Graff, MD  -Hoover Browns, MD - Osborn Coho, MD -Marline Backbone, MD ---Dierdre Forth, MD is listed on their site, but I'm pretty sure she retired.  [facility] -Ellison Hughs, MD -Pryor Ochoa, DO  [facility] -Gerald Leitz, MD  -Steva Ready, DO  [facility] -Derl Barrow, MD -Marlow Baars, MD  [facility] Shea Evans, MD  [facility] for [facility] Yevette Edwards, MD   You can take vitamin B6 (50 mg daily) and Unisom (half tab twice a day) and eating a bland diet to help with nausea.

## 2023-03-27 NOTE — Progress Notes (Signed)
Established Patient Office Visit   Subjective  Patient ID: Cassie Garrett, female    DOB: 1995-11-28  Age: 27 y.o. MRN: [id number]  Chief Complaint  Patient presents with   Dizziness    For 3 day, jut st. Last cycle was 6/14arted all of a sudden. Did take a pregnancy test   Pt accompanied by her boyfriend.  Patient is a 27 year old female who presents for acute concern.  Patient endorses feeling intermittent dizziness over the last 3 days.  Several of the episodes lasted briefly while patient was driving.  Pt has had less of an appetite and drinking maybe 2 bottles of water per day.  Patient notes positive home hCG.  LMP 02/21/2023.  Having some nausea in the mornings.  Not currently on PNV.  Patient does not have an OB/GYN.  Dizziness    Past Medical History:  Diagnosis Date   Eczema    Iron deficiency    No past surgical history on file. Social History   Tobacco Use   Smoking status: Never    Passive exposure: Current   Smokeless tobacco: Never  Vaping Use   Vaping status: Never Used  Substance Use Topics   Alcohol use: No    Alcohol/week: 0.0 standard drinks of alcohol   Drug use: No   No family history on file. No Known Allergies    Review of Systems  Neurological:  Positive for dizziness.   Negative unless stated above    Objective:     BP 124/68 (BP Location: Left Arm, Patient Position: Sitting, Cuff Size: Normal)   Temp 98.7 F (37.1 C) (Oral)   Wt 186 lb 12.8 oz (84.7 kg)   SpO2 96%   BMI 32.06 kg/m    Physical Exam Constitutional:      General: She is not in acute distress.    Appearance: Normal appearance.  HENT:     Head: Normocephalic and atraumatic.     Nose: Nose normal.     Mouth/Throat:     Mouth: Mucous membranes are moist.  Eyes:     Extraocular Movements: Extraocular movements intact.     Conjunctiva/sclera: Conjunctivae normal.  Neck:     Vascular: No carotid bruit.  Cardiovascular:     Rate and Rhythm: Normal rate and  regular rhythm.     Heart sounds: Normal heart sounds. No murmur heard.    No gallop.  Pulmonary:     Effort: Pulmonary effort is normal. No respiratory distress.     Breath sounds: Normal breath sounds. No wheezing, rhonchi or rales.  Skin:    General: Skin is warm and dry.  Neurological:     Mental Status: She is alert and oriented to person, place, and time.      Results for orders placed or performed in visit on 03/27/23  POCT urine pregnancy  Result Value Ref Range   Preg Test, Ur Positive (A) Negative  POCT Urinalysis Dipstick (Automated)  Result Value Ref Range   Color, UA dark brown    Clarity, UA cloudy    Glucose, UA Negative Negative   Bilirubin, UA neg    Ketones, UA 1+    Spec Grav, UA 1.020 1.010 - 1.025   Blood, UA neg    pH, UA 6.5 5.0 - 8.0   Protein, UA Positive (A) Negative   Urobilinogen, UA negative (A) 0.2 or 1.0 E.U./dL   Nitrite, UA neg    Leukocytes, UA Trace (A) Negative  Assessment & Plan:  Dizziness -     Urine Culture -     POCT urinalysis dipstick -     CBC with Differential/Platelet -     Iron, TIBC and Ferritin Panel  Missed menses -     POCT urine pregnancy -     POCT Urinalysis Dipstick (Automated) -     hCG, quantitative, pregnancy  Less than [redacted] weeks gestation of pregnancy -     prenatal multivitamin; Take 1 tablet by mouth daily at 12 noon.  Dispense: 30 tablet; Refill: 5  Dizziness likely 2/2 dehydration and hyperglycemia.  UA with protein, trace leuks, 1+ ketones, dark and cloudy in color.  Will obtain urine culture.  Patient advised to increase p.o. intake of water and fluids.  Given history of anemia will obtain labs.  Urine hCG in clinic positive with LMP 02/21/23.  Given info on area OB/GYN providers.  Patient to start PNV.  Can try B6 and Unisom for nausea.  Given strict precautions.  Return if symptoms worsen or fail to improve.   Deeann Saint, MD

## 2023-03-28 LAB — URINE CULTURE
MICRO NUMBER:: 15217726
SPECIMEN QUALITY:: ADEQUATE

## 2023-03-28 LAB — CBC WITH DIFFERENTIAL/PLATELET
Basophils Absolute: 0 10*3/uL (ref 0.0–0.1)
Basophils Relative: 0.6 % (ref 0.0–3.0)
Eosinophils Absolute: 0 10*3/uL (ref 0.0–0.7)
Eosinophils Relative: 0.5 % (ref 0.0–5.0)
HCT: 35.8 % — ABNORMAL LOW (ref 36.0–46.0)
Hemoglobin: 10.8 g/dL — ABNORMAL LOW (ref 12.0–15.0)
Lymphocytes Relative: 30.5 % (ref 12.0–46.0)
Lymphs Abs: 2.1 10*3/uL (ref 0.7–4.0)
MCHC: 30.3 g/dL (ref 30.0–36.0)
MCV: 67.7 fl — ABNORMAL LOW (ref 78.0–100.0)
Monocytes Absolute: 0.7 10*3/uL (ref 0.1–1.0)
Monocytes Relative: 10.1 % (ref 3.0–12.0)
Neutro Abs: 4 10*3/uL (ref 1.4–7.7)
Neutrophils Relative %: 58.3 % (ref 43.0–77.0)
Platelets: 464 10*3/uL — ABNORMAL HIGH (ref 150.0–400.0)
RBC: 5.29 Mil/uL — ABNORMAL HIGH (ref 3.87–5.11)
RDW: 17.9 % — ABNORMAL HIGH (ref 11.5–15.5)
WBC: 6.9 10*3/uL (ref 4.0–10.5)

## 2023-03-28 LAB — HCG, QUANTITATIVE, PREGNANCY: Quantitative HCG: >1308 m[IU]/mL

## 2023-03-28 LAB — IRON,TIBC AND FERRITIN PANEL
%SAT: 4 % (calc) — ABNORMAL LOW (ref 16–45)
Ferritin: 3 ng/mL — ABNORMAL LOW (ref 16–154)
Iron: 20 ug/dL — ABNORMAL LOW (ref 40–190)
TIBC: 467 mcg/dL (calc) — ABNORMAL HIGH (ref 250–450)

## 2023-04-01 ENCOUNTER — Encounter: Payer: Self-pay | Admitting: Family Medicine

## 2023-04-02 ENCOUNTER — Encounter: Payer: Self-pay | Admitting: Family Medicine

## 2023-04-02 ENCOUNTER — Ambulatory Visit: Payer: Medicaid Other | Admitting: Family Medicine

## 2023-04-02 ENCOUNTER — Other Ambulatory Visit (HOSPITAL_COMMUNITY): Payer: Self-pay

## 2023-04-02 VITALS — BP 112/58 | HR 110 | Temp 98.9°F | Ht 64.0 in | Wt 191.2 lb

## 2023-04-02 DIAGNOSIS — D649 Anemia, unspecified: Secondary | ICD-10-CM | POA: Diagnosis not present

## 2023-04-02 DIAGNOSIS — N3000 Acute cystitis without hematuria: Secondary | ICD-10-CM | POA: Diagnosis not present

## 2023-04-02 DIAGNOSIS — R350 Frequency of micturition: Secondary | ICD-10-CM

## 2023-04-02 LAB — POC URINALSYSI DIPSTICK (AUTOMATED)
Blood, UA: NEGATIVE
Glucose, UA: NEGATIVE
Ketones, UA: NEGATIVE
Nitrite, UA: POSITIVE
Protein, UA: NEGATIVE
Spec Grav, UA: 1.015 (ref 1.010–1.025)
Urobilinogen, UA: 2 E.U./dL — AB
pH, UA: 7 (ref 5.0–8.0)

## 2023-04-02 MED ORDER — AMOXICILLIN-POT CLAVULANATE 500-125 MG PO TABS: 1.0000 | ORAL_TABLET | Freq: Two times a day (BID) | ORAL | 0 refills | Status: AC

## 2023-04-02 NOTE — Telephone Encounter (Signed)
Pt seen on 7/24.  Matter addressed.

## 2023-04-02 NOTE — Patient Instructions (Addendum)
A prescription for Augmentin was sent to pharmacy to help treat the urinary tract infection.  We will still send urine for culture to make sure that the antibiotic works well for the bacteria causing the infection.

## 2023-04-02 NOTE — Progress Notes (Signed)
Established Patient Office Visit   Subjective  Patient ID: Cassie Garrett, female    DOB: [birth year]  Age: 27 y.o. MRN: [id number]  Chief Complaint  Patient presents with   Urinary Frequency    X1 day, tried AZO with some relief   Pelvic Pain    X1 day    Patient is a 27 year old seen for acute concern.  Patient endorses urinary frequency, decreased UOP, dysuria, suprapubic/abdominal discomfort x 1 day.  Patient tried Azo for symptoms.  Try to increase water intake.  Patient recently seen for dizziness and absent menses.  hCG positive.  Patient planning on having EAB.  Concerned about anemia.  Hemoglobin was 10.8 on 03/27/2023.  Urinary Frequency  Associated symptoms include frequency.  Pelvic Pain The patient's primary symptoms include pelvic pain. Associated symptoms include frequency.      Review of Systems  Genitourinary:  Positive for frequency and pelvic pain.   Negative unless stated above    Objective:     BP (!) 112/58 (BP Location: Left Arm, Patient Position: Sitting, Cuff Size: Normal)   Pulse (!) 110   Temp 98.9 F (37.2 C) (Oral)   Ht 5\' 4"  (1.626 m)   Wt 191 lb 3.2 oz (86.7 kg)   LMP 02/21/2023 (Exact Date)   SpO2 99%   Breastfeeding Unknown   BMI 32.82 kg/m    Physical Exam Constitutional:      General: She is not in acute distress.    Appearance: Normal appearance.  HENT:     Head: Normocephalic and atraumatic.     Nose: Nose normal.     Mouth/Throat:     Mouth: Mucous membranes are moist.  Cardiovascular:     Rate and Rhythm: Normal rate and regular rhythm.     Heart sounds: Normal heart sounds. No murmur heard.    No gallop.  Pulmonary:     Effort: Pulmonary effort is normal. No respiratory distress.     Breath sounds: Normal breath sounds. No wheezing, rhonchi or rales.  Abdominal:     General: Bowel sounds are normal.     Palpations: Abdomen is soft.     Tenderness: There is abdominal tenderness in the suprapubic area, left  upper quadrant and left lower quadrant. There is no right CVA tenderness or left CVA tenderness.  Skin:    General: Skin is warm and dry.  Neurological:     Mental Status: She is alert and oriented to person, place, and time.      Results for orders placed or performed in visit on 04/02/23  POCT Urinalysis Dipstick (Automated)  Result Value Ref Range   Color, UA orange    Clarity, UA cloudy    Glucose, UA Negative Negative   Bilirubin, UA 1+    Ketones, UA negative    Spec Grav, UA 1.015 1.010 - 1.025   Blood, UA negative    pH, UA 7.0 5.0 - 8.0   Protein, UA Negative Negative   Urobilinogen, UA 2.0 (A) 0.2 or 1.0 E.U./dL   Nitrite, UA positive    Leukocytes, UA Moderate (2+) (A) Negative      Assessment & Plan:  Acute cystitis without hematuria -     Amoxicillin-Pot Clavulanate; Take 1 tablet by mouth in the morning and at bedtime for 7 days.  Dispense: 14 tablet; Refill: 0 -     Urine Culture; Future  Urinary frequency -     POCT Urinalysis Dipstick (Automated) -  Urine Culture; Future  Anemia, unspecified type  Concern for UTI.  POC UA with leuks, nitrites.  Start abx for UTI.  Obtain Ucx.  Further recommendations if needed based on culture results.  P.o. OTC iron for anemia.  Hemoglobin 10.8 on 03/27/2023.  Can use vitamin C tabs or take with orange juice to help increase absorption.  Continue to monitor.  Return if symptoms worsen or fail to improve.   Deeann Saint, MD

## 2023-04-04 ENCOUNTER — Other Ambulatory Visit: Payer: Self-pay

## 2023-04-14 ENCOUNTER — Encounter: Payer: Self-pay | Admitting: Family Medicine

## 2023-04-14 DIAGNOSIS — B379 Candidiasis, unspecified: Secondary | ICD-10-CM

## 2023-04-15 MED ORDER — FLUCONAZOLE 150 MG PO TABS: 150.0000 mg | ORAL_TABLET | Freq: Once | ORAL | 0 refills | Status: AC

## 2023-04-15 NOTE — Telephone Encounter (Signed)
Patient also reported yeast infection status post antibiotic use for UTI.  Rx for Diflucan sent to pharmacy.

## 2023-05-12 ENCOUNTER — Ambulatory Visit
Admission: EM | Admit: 2023-05-12 | Discharge: 2023-05-12 | Disposition: A | Discharge status: Home / Self Care | Payer: Medicaid Other | Attending: Internal Medicine | Admitting: Internal Medicine

## 2023-05-12 VITALS — BP 117/66 | HR 108 | Temp 98.0°F | Resp 16

## 2023-05-12 DIAGNOSIS — R300 Dysuria: Secondary | ICD-10-CM | POA: Insufficient documentation

## 2023-05-12 DIAGNOSIS — Z113 Encounter for screening for infections with a predominantly sexual mode of transmission: Secondary | ICD-10-CM | POA: Insufficient documentation

## 2023-05-12 LAB — POCT URINALYSIS DIP (MANUAL ENTRY)
Bilirubin, UA: NEGATIVE
Glucose, UA: 100 mg/dL — AB
Ketones, POC UA: NEGATIVE mg/dL
Leukocytes, UA: NEGATIVE
Nitrite, UA: POSITIVE — AB
Protein Ur, POC: NEGATIVE mg/dL
Spec Grav, UA: >=1.03 — AB (ref 1.010–1.025)
Urobilinogen, UA: 0.2 U/dL
pH, UA: 5.5 (ref 5.0–8.0)

## 2023-05-12 MED ORDER — NITROFURANTOIN MONOHYD MACRO 100 MG PO CAPS
100.0000 mg | ORAL_CAPSULE | Freq: Two times a day (BID) | ORAL | 0 refills | Status: DC
Start: 2023-05-12 — End: 2023-05-22

## 2023-05-12 NOTE — ED Triage Notes (Signed)
Pt states burning with urination for the past 5 days.

## 2023-05-12 NOTE — Discharge Instructions (Signed)
Antibiotic has been sent to treat urinary tract infection.  Urine culture and vaginal swab is pending.  Will call with the result.

## 2023-05-12 NOTE — ED Provider Notes (Addendum)
[facility]    CSN: [id number] Arrival date & time: 05/12/23  1305      History   Chief Complaint Chief Complaint  Patient presents with   Dysuria    HPI Cassie Garrett is a 27 y.o. female.   Patient presents with urinary burning and frequency that started about 5 days ago.  Denies hematuria, vaginal discharge, back pain, abdominal pain, fever.  Denies exposure to STD or any recent unprotected intercourse.  Patient reports that she started her menstrual cycle yesterday and has been having normal menstrual cycles.  Patient states that she has recurrent UTIs with last 1 being approximately 1 to 2 months ago.  Patient had positive pregnancy test in July but reports that this pregnancy was terminated.   Dysuria   Past Medical History:  Diagnosis Date   Eczema    Iron deficiency     Patient Active Problem List   Diagnosis Date Noted   Atopic dermatitis 10/14/2022   Anemia 05/14/2019    History reviewed. No pertinent surgical history.  OB History     Gravida  1   Para      Term      Preterm      AB      Living         SAB      IAB      Ectopic      Multiple      Live Births               Home Medications    Prior to Admission medications   Medication Sig Start Date End Date Taking? Authorizing Provider  nitrofurantoin, macrocrystal-monohydrate, (MACROBID) 100 MG capsule Take 1 capsule (100 mg total) by mouth 2 (two) times daily. 05/12/23  Yes Kainoah Bartosiewicz, Rolly Salter E, FNP  dupilumab (DUPIXENT) 300 MG/2ML prefilled syringe Inject 300 mg into the skin every 14 (fourteen) days. 02/17/23   Birder Robson, MD  hydrocortisone 2.5 % cream Apply topically 2 (two) times daily. Apply twice daily for flare ups above neck, maximum 7 days. 02/14/23   Birder Robson, MD  ibuprofen (ADVIL) 800 MG tablet Take 800 mg by mouth every 8 (eight) hours as needed for mild pain, cramping or moderate pain. 01/05/23   [provider]  Prenatal Vit-Fe  Fumarate-FA (PRENATAL MULTIVITAMIN) TABS tablet Take 1 tablet by mouth daily at 12 noon. 03/27/23   Deeann Saint, MD  tacrolimus (PROTOPIC) 0.1 % ointment Apply topically 2 (two) times daily. 02/17/23   Birder Robson, MD  triamcinolone ointment (KENALOG) 0.1 % Apply twice daily for flare ups below neck, maximum 10 days. 02/14/23   Birder Robson, MD  Vitamin D, Ergocalciferol, (DRISDOL) 1.25 MG (50000 UNIT) CAPS capsule Take 50,000 Units by mouth 3 (three) times a week. 12/31/22   [provider]    Family History History reviewed. No pertinent family history.  Social History Social History   Tobacco Use   Smoking status: Never    Passive exposure: Current   Smokeless tobacco: Never  Vaping Use   Vaping status: Never Used  Substance Use Topics   Alcohol use: No    Alcohol/week: 0.0 standard drinks of alcohol   Drug use: No     Allergies   Patient has no known allergies.   Review of Systems Review of Systems Per HPI  Physical Exam Triage Vital Signs ED Triage Vitals  Encounter Vitals Group     BP 05/12/23 1312  117/66     Systolic BP Percentile --      Diastolic BP Percentile --      Pulse Rate 05/12/23 1312 (!) 108     Resp 05/12/23 1312 16     Temp 05/12/23 1312 98 F (36.7 C)     Temp Source 05/12/23 1312 Oral     SpO2 05/12/23 1312 96 %     Weight --      Height --      Head Circumference --      Peak Flow --      Pain Score 05/12/23 1314 0     Pain Loc --      Pain Education --      Exclude from Growth Chart --    No data found.  Updated Vital Signs BP 117/66 (BP Location: Left Arm)   Pulse (!) 108   Temp 98 F (36.7 C) (Oral)   Resp 16   LMP 05/11/2023 (Exact Date)   SpO2 96%   Visual Acuity Right Eye Distance:   Left Eye Distance:   Bilateral Distance:    Right Eye Near:   Left Eye Near:    Bilateral Near:     Physical Exam Constitutional:      General: She is not in acute distress.    Appearance: Normal appearance. She is  not toxic-appearing or diaphoretic.  HENT:     Head: Normocephalic and atraumatic.  Eyes:     Extraocular Movements: Extraocular movements intact.     Conjunctiva/sclera: Conjunctivae normal.  Pulmonary:     Effort: Pulmonary effort is normal.  Genitourinary:    Comments: Deferred with shared decision making. Self swab performed.  Neurological:     General: No focal deficit present.     Mental Status: She is alert and oriented to person, place, and time. Mental status is at baseline.  Psychiatric:        Mood and Affect: Mood normal.        Behavior: Behavior normal.        Thought Content: Thought content normal.        Judgment: Judgment normal.      UC Treatments / Results  Labs (all labs ordered are listed, but only abnormal results are displayed) Labs Reviewed  POCT URINALYSIS DIP (MANUAL ENTRY) - Abnormal; Notable for the following components:      Result Value   Color, UA orange (*)    Glucose, UA =100 (*)    Spec Grav, UA >=1.030 (*)    Blood, UA trace-intact (*)    Nitrite, UA Positive (*)    All other components within normal limits  URINE CULTURE  CERVICOVAGINAL ANCILLARY ONLY    EKG   Radiology No results found.  Procedures Procedures (including critical care time)  Medications Ordered in UC Medications - No data to display  Initial Impression / Assessment and Plan / UC Course  I have reviewed the triage vital signs and the nursing notes.  Pertinent labs & imaging results that were available during my care of the patient were reviewed by me and considered in my medical decision making (see chart for details).     UA not definitive for UTI, but with associated symptoms will opt to treat with Macrobid for urinary tract infection.  Patient appears to have had a UTI in June and July so may need follow-up with urology as well. Vaginal swab pending to rule out vaginitis as cause of symptoms.  Urine culture  pending as well.   Advised strict follow-up  precautions.  Pregnancy test deferred given she states that she is currently on her menstrual cycle. Advised follow up with OBGYN as well given recent termination of pregnancy. Patient verbalized understanding and was agreeable with plan. Final Clinical Impressions(s) / UC Diagnoses   Final diagnoses:  Dysuria  Screening examination for venereal disease     Discharge Instructions      Antibiotic has been sent to treat urinary tract infection.  Urine culture and vaginal swab is pending.  Will call with the result.    ED Prescriptions     Medication Sig Dispense Auth. Provider   nitrofurantoin, macrocrystal-monohydrate, (MACROBID) 100 MG capsule Take 1 capsule (100 mg total) by mouth 2 (two) times daily. 10 capsule Gustavus Bryant, [place]      PDMP not reviewed this encounter.   Gustavus Bryant, [place] 05/12/23 1357    Gustavus Bryant, [place] 05/12/23 1400

## 2023-05-13 LAB — URINE CULTURE: Culture: NO GROWTH

## 2023-05-15 ENCOUNTER — Other Ambulatory Visit: Payer: Self-pay | Admitting: Physician Assistant

## 2023-05-15 DIAGNOSIS — D508 Other iron deficiency anemias: Secondary | ICD-10-CM

## 2023-05-15 LAB — CERVICOVAGINAL ANCILLARY ONLY
Bacterial Vaginitis (gardnerella): NEGATIVE
Candida Glabrata: NEGATIVE
Candida Vaginitis: NEGATIVE
Chlamydia: NEGATIVE
Comment: NEGATIVE
Comment: NEGATIVE
Comment: NEGATIVE
Comment: NEGATIVE
Comment: NEGATIVE
Comment: NORMAL
Neisseria Gonorrhea: NEGATIVE
Trichomonas: NEGATIVE

## 2023-05-16 ENCOUNTER — Inpatient Hospital Stay: Payer: Medicaid Other | Attending: Family Medicine

## 2023-05-16 ENCOUNTER — Inpatient Hospital Stay: Payer: Medicaid Other | Admitting: Physician Assistant

## 2023-05-22 ENCOUNTER — Ambulatory Visit (INDEPENDENT_AMBULATORY_CARE_PROVIDER_SITE_OTHER): Payer: Medicaid Other | Admitting: Internal Medicine

## 2023-05-22 ENCOUNTER — Encounter: Payer: Self-pay | Admitting: Internal Medicine

## 2023-05-22 VITALS — BP 110/70 | HR 105 | Temp 98.2°F | Wt 191.9 lb

## 2023-05-22 DIAGNOSIS — K529 Noninfective gastroenteritis and colitis, unspecified: Secondary | ICD-10-CM

## 2023-05-22 NOTE — Progress Notes (Signed)
Established Patient Office Visit     CC/Reason for Visit: Nausea, diarrhea  HPI: Cassie Garrett is a 27 y.o. female who is coming in today for the above mentioned reasons.  Overnight she had about 6 episodes of loose stool with nausea and abdominal cramping.  No emesis.  No fever.  She already feels improved.  She is needing a work note for today.  Past Medical/Surgical History: Past Medical History:  Diagnosis Date   Eczema    Iron deficiency     History reviewed. No pertinent surgical history.  Social History:  reports that she has never smoked. She has been exposed to tobacco smoke. She has never used smokeless tobacco. She reports that she does not drink alcohol and does not use drugs.  Allergies: No Known Allergies  Family History:  History reviewed. No pertinent family history.   Current Outpatient Medications:    dupilumab (DUPIXENT) 300 MG/2ML prefilled syringe, Inject 300 mg into the skin every 14 (fourteen) days. (Patient not taking: Reported on 05/22/2023), Disp: 4 mL, Rfl: 11   hydrocortisone 2.5 % cream, Apply topically 2 (two) times daily. Apply twice daily for flare ups above neck, maximum 7 days. (Patient not taking: Reported on 05/22/2023), Disp: 30 g, Rfl: 5   ibuprofen (ADVIL) 800 MG tablet, Take 800 mg by mouth every 8 (eight) hours as needed for mild pain, cramping or moderate pain. (Patient not taking: Reported on 05/22/2023), Disp: , Rfl:    Prenatal Vit-Fe Fumarate-FA (PRENATAL MULTIVITAMIN) TABS tablet, Take 1 tablet by mouth daily at 12 noon. (Patient not taking: Reported on 05/22/2023), Disp: 30 tablet, Rfl: 5   tacrolimus (PROTOPIC) 0.1 % ointment, Apply topically 2 (two) times daily. (Patient not taking: Reported on 05/22/2023), Disp: 100 g, Rfl: 5   triamcinolone ointment (KENALOG) 0.1 %, Apply twice daily for flare ups below neck, maximum 10 days. (Patient not taking: Reported on 05/22/2023), Disp: 80 g, Rfl: 5   Vitamin D, Ergocalciferol,  (DRISDOL) 1.25 MG (50000 UNIT) CAPS capsule, Take 50,000 Units by mouth 3 (three) times a week. (Patient not taking: Reported on 05/22/2023), Disp: , Rfl:   Current Facility-Administered Medications:    dupilumab (DUPIXENT) prefilled syringe 300 mg, 300 mg, Subcutaneous, Q14 Days, Padgett, Pilar Grammes, MD, 300 mg at 11/27/22 1012  Review of Systems:  Negative unless indicated in HPI.   Physical Exam: Vitals:   05/22/23 1535  BP: 110/70  Pulse: (!) 105  Temp: 98.2 F (36.8 C)  TempSrc: Oral  SpO2: 99%  Weight: 191 lb 14.4 oz (87 kg)    Body mass index is 32.94 kg/m.   Physical Exam Vitals reviewed.  Constitutional:      Appearance: Normal appearance.  HENT:     Head: Normocephalic and atraumatic.  Eyes:     Conjunctiva/sclera: Conjunctivae normal.     Pupils: Pupils are equal, round, and reactive to light.  Skin:    General: Skin is warm and dry.  Neurological:     General: No focal deficit present.     Mental Status: She is alert and oriented to person, place, and time.  Psychiatric:        Mood and Affect: Mood normal.        Behavior: Behavior normal.        Thought Content: Thought content normal.        Judgment: Judgment normal.      Impression and Plan:  Gastroenteritis  -Seems to already be resolving, no  further treatment other than fluid repletion. -Work note provided.   Time spent:21 minutes reviewing chart, interviewing and examining patient and formulating plan of care.     Chaya Jan, MD [facility]

## 2023-05-23 ENCOUNTER — Telehealth: Payer: Self-pay | Admitting: Physician Assistant

## 2023-06-03 ENCOUNTER — Inpatient Hospital Stay: Payer: Medicaid Other | Admitting: Physician Assistant

## 2023-06-03 ENCOUNTER — Inpatient Hospital Stay: Payer: Medicaid Other

## 2023-06-03 ENCOUNTER — Telehealth: Payer: Self-pay | Admitting: Physician Assistant

## 2023-06-10 ENCOUNTER — Inpatient Hospital Stay: Payer: Medicaid Other | Admitting: Physician Assistant

## 2023-06-10 ENCOUNTER — Inpatient Hospital Stay: Payer: Medicaid Other

## 2023-06-10 ENCOUNTER — Telehealth: Payer: Self-pay | Admitting: Physician Assistant

## 2023-06-10 NOTE — Telephone Encounter (Signed)
Per Staff message 06/10/23 I called patient and left voice message with the date and time of her rescheduled appointment. I will also mail out a reminder notice.

## 2023-06-25 ENCOUNTER — Inpatient Hospital Stay: Payer: Medicaid Other | Admitting: Physician Assistant

## 2023-06-25 ENCOUNTER — Inpatient Hospital Stay: Payer: Medicaid Other | Attending: Physician Assistant

## 2023-06-25 VITALS — BP 105/80 | HR 88 | Temp 97.5°F | Resp 18 | Ht 64.0 in | Wt 192.7 lb

## 2023-06-25 DIAGNOSIS — D500 Iron deficiency anemia secondary to blood loss (chronic): Secondary | ICD-10-CM

## 2023-06-25 DIAGNOSIS — D649 Anemia, unspecified: Secondary | ICD-10-CM

## 2023-06-25 DIAGNOSIS — D509 Iron deficiency anemia, unspecified: Secondary | ICD-10-CM | POA: Insufficient documentation

## 2023-06-25 DIAGNOSIS — N920 Excessive and frequent menstruation with regular cycle: Secondary | ICD-10-CM | POA: Insufficient documentation

## 2023-06-25 LAB — CBC WITH DIFFERENTIAL (CANCER CENTER ONLY)
Abs Immature Granulocytes: 0.02 10*3/uL (ref 0.00–0.07)
Basophils Absolute: 0 10*3/uL (ref 0.0–0.1)
Basophils Relative: 1 %
Eosinophils Absolute: 0.1 10*3/uL (ref 0.0–0.5)
Eosinophils Relative: 1 %
HCT: 35.9 % — ABNORMAL LOW (ref 36.0–46.0)
Hemoglobin: 10.3 g/dL — ABNORMAL LOW (ref 12.0–15.0)
Immature Granulocytes: 0 %
Lymphocytes Relative: 38 %
Lymphs Abs: 2.4 10*3/uL (ref 0.7–4.0)
MCH: 19.3 pg — ABNORMAL LOW (ref 26.0–34.0)
MCHC: 28.7 g/dL — ABNORMAL LOW (ref 30.0–36.0)
MCV: 67.1 fL — ABNORMAL LOW (ref 80.0–100.0)
Monocytes Absolute: 0.7 10*3/uL (ref 0.1–1.0)
Monocytes Relative: 11 %
Neutro Abs: 3.1 10*3/uL (ref 1.7–7.7)
Neutrophils Relative %: 49 %
Platelet Count: 548 10*3/uL — ABNORMAL HIGH (ref 150–400)
RBC: 5.35 MIL/uL — ABNORMAL HIGH (ref 3.87–5.11)
RDW: 17.1 % — ABNORMAL HIGH (ref 11.5–15.5)
WBC Count: 6.3 10*3/uL (ref 4.0–10.5)
nRBC: 0 % (ref 0.0–0.2)

## 2023-06-25 LAB — IRON AND IRON BINDING CAPACITY (CC-WL,HP ONLY)
Iron: 21 ug/dL — ABNORMAL LOW (ref 28–170)
Saturation Ratios: 4 % — ABNORMAL LOW (ref 10.4–31.8)
TIBC: 584 ug/dL — ABNORMAL HIGH (ref 250–450)
UIBC: 563 ug/dL — ABNORMAL HIGH (ref 148–442)

## 2023-06-25 LAB — FERRITIN: Ferritin: 2 ng/mL — ABNORMAL LOW (ref 11–307)

## 2023-06-25 NOTE — Progress Notes (Signed)
[facility] [phone]   Fax:([phone]  PROGRESS NOTE  Patient Care Team: Deeann Saint, MD as PCP - General (Family Medicine)   CHIEF COMPLAINTS/PURPOSE OF CONSULTATION:  Iron deficiency anemia 2/2 menorrhagia  HISTORY OF PRESENTING ILLNESS:  Cassie Garrett 27 y.o. female presents for a follow up visit for iron deficiency anemia. She was last seen by Dr. Clelia Croft on 07/24/2022. She is unaccompanied for this visit.   On exam today, Ms. Henson reports that she has noticed progressive fatigue that does not improve with rest. She is able to complete her ADLs at this time. She is craving ice throughout the day. She reports having a monthly menstrual cycle lasting approximately 8 days with 4 days of heavy bleeding. She is planning to start birth control in the near future. She denies fevers, chills, sweats, shortness of breath, chest pain, cough, headaches, dizziness, nausea, vomiting or bowel habit changes. She has no other complaints. Rest of the ROS is below.   MEDICAL HISTORY:  Past Medical History:  Diagnosis Date   Eczema    Iron deficiency     SURGICAL HISTORY: No past surgical history on file.  SOCIAL HISTORY: Social History   Socioeconomic History   Marital status: Single    Spouse name: Not on file   Number of children: Not on file   Years of education: Not on file   Highest education level: Not on file  Occupational History   Not on file  Tobacco Use   Smoking status: Never    Passive exposure: Current   Smokeless tobacco: Never  Vaping Use   Vaping status: Never Used  Substance and Sexual Activity   Alcohol use: No    Alcohol/week: 0.0 standard drinks of alcohol   Drug use: No   Sexual activity: Yes    Birth control/protection: Pill  Other Topics Concern   Not on file  Social History Narrative   Not on file   Social Determinants of Health   Financial Resource Strain: Not on file  Food Insecurity: Not on file   Transportation Needs: Not on file  Physical Activity: Not on file  Stress: Not on file  Social Connections: Unknown (01/07/2022)   Received from [facility]   Social Network    Social Network: Not on file  Intimate Partner Violence: Unknown (12/10/2021)   Received from [facility]   HITS    Physically Hurt: Not on file    Insult or Talk Down To: Not on file    Threaten Physical Harm: Not on file    Scream or Curse: Not on file    FAMILY HISTORY: No family history on file.  ALLERGIES:  has No Known Allergies.  MEDICATIONS:  Current Outpatient Medications  Medication Sig Dispense Refill   triamcinolone ointment (KENALOG) 0.1 % Apply twice daily for flare ups below neck, maximum 10 days. 80 g 5   dupilumab (DUPIXENT) 300 MG/2ML prefilled syringe Inject 300 mg into the skin every 14 (fourteen) days. (Patient not taking: Reported on 05/22/2023) 4 mL 11   hydrocortisone 2.5 % cream Apply topically 2 (two) times daily. Apply twice daily for flare ups above neck, maximum 7 days. (Patient not taking: Reported on 05/22/2023) 30 g 5   ibuprofen (ADVIL) 800 MG tablet Take 800 mg by mouth every 8 (eight) hours as needed for mild pain, cramping or moderate pain. (Patient not taking: Reported on 05/22/2023)     Prenatal Vit-Fe Fumarate-FA (  PRENATAL MULTIVITAMIN) TABS tablet Take 1 tablet by mouth daily at 12 noon. (Patient not taking: Reported on 05/22/2023) 30 tablet 5   tacrolimus (PROTOPIC) 0.1 % ointment Apply topically 2 (two) times daily. (Patient not taking: Reported on 05/22/2023) 100 g 5   Vitamin D, Ergocalciferol, (DRISDOL) 1.25 MG (50000 UNIT) CAPS capsule Take 50,000 Units by mouth 3 (three) times a week. (Patient not taking: Reported on 05/22/2023)     Current Facility-Administered Medications  Medication Dose Route Frequency Provider Last Rate Last Admin   dupilumab (DUPIXENT) prefilled syringe 300 mg  300 mg Subcutaneous Q14 Days Marcelyn Bruins,  MD   300 mg at 11/27/22 1012    REVIEW OF SYSTEMS:   Constitutional: ( - ) fevers, ( - )  chills , ( - ) night sweats Eyes: ( - ) blurriness of vision, ( - ) double vision, ( - ) watery eyes Ears, nose, mouth, throat, and face: ( - ) mucositis, ( - ) sore throat Respiratory: ( - ) cough, ( - ) dyspnea, ( - ) wheezes Cardiovascular: ( - ) palpitation, ( - ) chest discomfort, ( - ) lower extremity swelling Gastrointestinal:  ( - ) nausea, ( - ) heartburn, ( - ) change in bowel habits Skin: ( - ) abnormal skin rashes Lymphatics: ( - ) new lymphadenopathy, ( - ) easy bruising Neurological: ( - ) numbness, ( - ) tingling, ( - ) new weaknesses Behavioral/Psych: ( - ) mood change, ( - ) new changes  All other systems were reviewed with the patient and are negative.  PHYSICAL EXAMINATION: ECOG PERFORMANCE STATUS: 1 - Symptomatic but completely ambulatory  Vitals:   06/25/23 0842  BP: 105/80  Pulse: 88  Resp: 18  Temp: (!) 97.5 F (36.4 C)  SpO2: 100%   Filed Weights   06/25/23 0842  Weight: 192 lb 11.2 oz (87.4 kg)    GENERAL: well appearing female in NAD  SKIN: skin color, texture, turgor are normal, no rashes or significant lesions EYES: conjunctiva are pink and non-injected, sclera clear LUNGS: clear to auscultation and percussion with normal breathing effort HEART: regular rate & rhythm and no murmurs and no lower extremity edema Musculoskeletal: no cyanosis of digits and no clubbing  PSYCH: alert & oriented x 3, fluent speech NEURO: no focal motor/sensory deficits  LABORATORY DATA:  I have reviewed the data as listed    Latest Ref Rng & Units 06/25/2023    8:26 AM 03/27/2023    4:26 PM 07/24/2022    3:26 PM  CBC  WBC 4.0 - 10.5 K/uL 6.3  6.9  7.1   Hemoglobin 12.0 - 15.0 g/dL 16.1  09.6  9.8   Hematocrit 36.0 - 46.0 % 35.9  35.8  33.1   Platelets 150 - 400 K/uL 548  464.0  414        Latest Ref Rng & Units 04/14/2019   12:04 AM  CMP  Glucose 70 - 99 mg/dL 94    BUN 6 - 20 mg/dL 8   Creatinine 0.45 - 4.09 mg/dL 8.11   Sodium 914 - 782 mmol/L 139   Potassium 3.5 - 5.1 mmol/L 2.9   Chloride 98 - 111 mmol/L 103   CO2 22 - 32 mmol/L 27   Calcium 8.9 - 10.3 mg/dL 9.5   Total Protein 6.5 - 8.1 g/dL 8.3   Total Bilirubin 0.3 - 1.2 mg/dL 0.3   Alkaline Phos 38 - 126 U/L 49   AST 15 -  41 U/L 30   ALT 0 - 44 U/L 20     ASSESSMENT & PLAN Aloria Looper is a 27 y.o. female who presents to the clinic for a follow up for iron deficiency anemia.   # Iron Deficiency Anemia 2/2 to GYN Bleeding -- Findings are consistent with iron deficiency anemia secondary to patient's menorrhagia --Encouraged her to follow-up with OB/GYN for better control of her menstrual cycles --Last received IV venofer 200 mg x 2 doses from 09/03/2022-09/14/2022.  --Labs today show anemia with Hgb 10.3, MCV 67.1. Iron panel shows deficiency with serum iron 21, TIBC 584, saturation 4%, ferritin 2.  --We will plan to proceed with IV iron therapy in order to help bolster the patient's blood counts --RTC in 8 weeks with repeat labs  No orders of the defined types were placed in this encounter.   All questions were answered. The patient knows to call the clinic with any problems, questions or concerns.  I have spent a total of 30 minutes minutes of face-to-face and non-face-to-face time, preparing to see the patient, performing a medically appropriate examination, counseling and educating the patient, ordering medications/tests/procedures, referring and communicating with other health care professionals, documenting clinical information in the electronic health record, independently interpreting results and communicating results to the patient, and care coordination.   Georga Kaufmann, PA-C Department of Hematology/Oncology [facility] Phone: [phone]

## 2023-06-26 ENCOUNTER — Telehealth: Payer: Self-pay

## 2023-06-26 NOTE — Telephone Encounter (Signed)
Cassie Garrett, the patient will be scheduled as soon as possible.  Auth Submission: NO AUTH NEEDED Site of care: Site of care: [facility] Payer: [employer] Medicaid Medication & CPT/J Code(s) submitted: Monoferric (Ferrci derisomaltose) [phone] Route of submission (phone, fax, portal): phone Phone # [phone] Fax # Auth type: Buy/Bill PB Units/visits requested: 1000mg  x 1 dose Reference number: automated system confirmed no auth needed Approval from: 06/26/23 to 09/09/23

## 2023-07-02 ENCOUNTER — Ambulatory Visit: Payer: Medicaid Other

## 2023-07-02 VITALS — BP 110/72 | HR 88 | Temp 98.1°F | Resp 16 | Ht 64.0 in | Wt 193.6 lb

## 2023-07-02 DIAGNOSIS — D500 Iron deficiency anemia secondary to blood loss (chronic): Secondary | ICD-10-CM

## 2023-07-02 DIAGNOSIS — N920 Excessive and frequent menstruation with regular cycle: Secondary | ICD-10-CM | POA: Diagnosis not present

## 2023-07-02 MED ORDER — SODIUM CHLORIDE 0.9 % IV SOLN
1000.0000 mg | Freq: Once | INTRAVENOUS | Status: AC
  Administered 2023-07-02: 1000 mg via INTRAVENOUS
  Filled 2023-07-02: qty 10

## 2023-07-02 NOTE — Progress Notes (Signed)
Diagnosis: Iron Deficiency Anemia  Provider:  Chilton Greathouse MD  Procedure: IV Infusion  IV Type: Peripheral, IV Location: R Antecubital  Monoferric (Ferric Derisomaltose), Dose: 1000 mg  Infusion Start Time: 1220  Infusion Stop Time: 1244  Post Infusion IV Care: Observation period completed and Peripheral IV Discontinued  Discharge: Condition: Stable, Destination: Home . AVS Declined  Performed by:  Wyvonne Lenz, RN

## 2023-07-15 ENCOUNTER — Ambulatory Visit: Payer: Medicaid Other | Admitting: Internal Medicine

## 2023-07-17 ENCOUNTER — Ambulatory Visit: Payer: Medicaid Other | Admitting: Family Medicine

## 2023-07-17 DIAGNOSIS — R102 Pelvic and perineal pain: Secondary | ICD-10-CM | POA: Insufficient documentation

## 2023-07-17 DIAGNOSIS — N83209 Unspecified ovarian cyst, unspecified side: Secondary | ICD-10-CM | POA: Insufficient documentation

## 2023-07-17 DIAGNOSIS — N390 Urinary tract infection, site not specified: Secondary | ICD-10-CM | POA: Insufficient documentation

## 2023-07-25 ENCOUNTER — Ambulatory Visit: Payer: Medicaid Other | Admitting: Family Medicine

## 2023-07-25 ENCOUNTER — Encounter: Payer: Self-pay | Admitting: Family Medicine

## 2023-07-25 VITALS — BP 110/70 | HR 75 | Temp 98.6°F | Ht 64.0 in | Wt 195.0 lb

## 2023-07-25 DIAGNOSIS — M545 Low back pain, unspecified: Secondary | ICD-10-CM | POA: Diagnosis not present

## 2023-07-25 DIAGNOSIS — R202 Paresthesia of skin: Secondary | ICD-10-CM

## 2023-07-25 DIAGNOSIS — D500 Iron deficiency anemia secondary to blood loss (chronic): Secondary | ICD-10-CM

## 2023-07-25 DIAGNOSIS — R1084 Generalized abdominal pain: Secondary | ICD-10-CM

## 2023-07-25 DIAGNOSIS — R631 Polydipsia: Secondary | ICD-10-CM

## 2023-07-25 LAB — POC URINALSYSI DIPSTICK (AUTOMATED)
Bilirubin, UA: NEGATIVE
Blood, UA: NEGATIVE
Glucose, UA: NEGATIVE
Ketones, UA: NEGATIVE
Leukocytes, UA: NEGATIVE
Nitrite, UA: NEGATIVE
Protein, UA: NEGATIVE
Spec Grav, UA: 1.025 (ref 1.010–1.025)
Urobilinogen, UA: 0.2 U/dL
pH, UA: 6 (ref 5.0–8.0)

## 2023-07-25 NOTE — Progress Notes (Signed)
Established Patient Office Visit   Subjective  Patient ID: Cassie Garrett, female    DOB: Sep 19, 1995  Age: 27 y.o. MRN: [id number]  Chief Complaint  Patient presents with   Back Pain    Patient has recurring UTI and feels like it could be that   Numbness    Numbness and cramping tingling in leg and feet, started a week ago after having an iron infusion a week prior,     Pt is a 27 yo female seen for acute concern.  Patient endorses numbness, tingling in legs and feet x 1 week.  Endorses increased thirst.  Symptoms started 1 week s/p iron infusion.  Patient also notes bilateral low back pain, 4/10, without radiation.  Patient denies dysuria, nausea, vomiting, constipation, fever, chills.  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  Patient tried ibuprofen for symptoms without relief.  Nothing improves symptoms or makes them worse.  Back Pain    Patient Active Problem List   Diagnosis Date Noted   Cyst of ovary 07/17/2023   Pain in pelvis 07/17/2023   Urinary tract infectious disease 07/17/2023   Iron deficiency anemia 06/25/2023   Eczema 10/14/2022   Anemia 05/14/2019   Past Medical History:  Diagnosis Date   Eczema    Iron deficiency    History reviewed. No pertinent surgical history. Social History   Tobacco Use   Smoking status: Never    Passive exposure: Current   Smokeless tobacco: Never  Vaping Use   Vaping status: Never Used  Substance Use Topics   Alcohol use: No    Alcohol/week: 0.0 standard drinks of alcohol   Drug use: No   History reviewed. No pertinent family history. No Known Allergies    Review of Systems  Musculoskeletal:  Positive for back pain.   Negative unless stated above    Objective:     BP 110/70 (BP Location: Right Arm, Patient Position: Sitting, Cuff Size: Normal)   Pulse 75   Temp 98.6 F (37 C) (Oral)   Ht 5\' 4"  (1.626 m)   Wt 195 lb (88.5  kg)   LMP 07/12/2023 (Exact Date)   SpO2 97%   BMI 33.47 kg/m  BP Readings from Last 3 Encounters:  07/25/23 110/70  07/02/23 110/72  06/25/23 105/80   Wt Readings from Last 3 Encounters:  07/25/23 195 lb (88.5 kg)  07/02/23 193 lb 9.6 oz (87.8 kg)  06/25/23 192 lb 11.2 oz (87.4 kg)      Physical Exam Constitutional:      General: She is not in acute distress.    Appearance: Normal appearance.  HENT:     Head: Normocephalic and atraumatic.     Nose: Nose normal.     Mouth/Throat:     Mouth: Mucous membranes are moist.  Cardiovascular:     Rate and Rhythm: Normal rate and regular rhythm.     Heart sounds: Normal heart sounds. No murmur heard.    No gallop.  Pulmonary:     Effort: Pulmonary effort is normal. No respiratory distress.     Breath sounds: Normal breath sounds. No wheezing, rhonchi or rales.  Abdominal:     Tenderness: There is generalized abdominal tenderness.  Musculoskeletal:     Cervical back: Normal.     Thoracic back: Normal.     Lumbar back: Tenderness present.       Back:  Comments: TTP of lumbosacral spine laterally.  Skin:    General: Skin is warm and dry.  Neurological:     Mental Status: She is alert and oriented to person, place, and time.      Results for orders placed or performed in visit on 07/25/23  POCT Urinalysis Dipstick (Automated)  Result Value Ref Range   Color, UA yellow    Clarity, UA clear    Glucose, UA Negative Negative   Bilirubin, UA neg    Ketones, UA neg    Spec Grav, UA 1.025 1.010 - 1.025   Blood, UA neg    pH, UA 6.0 5.0 - 8.0   Protein, UA Negative Negative   Urobilinogen, UA 0.2 0.2 or 1.0 E.U./dL   Nitrite, UA neg    Leukocytes, UA Negative Negative      Assessment & Plan:  Acute low back pain without sciatica, unspecified back pain laterality -     POCT Urinalysis Dipstick (Automated)  Paresthesia -     Comprehensive metabolic panel -     CBC with Differential/Platelet -     Vitamin  B12  Iron deficiency anemia due to chronic blood loss -     CBC with Differential/Platelet  Increased thirst -     POCT glycosylated hemoglobin (Hb A1C)  Generalized abdominal pain -     Comprehensive metabolic panel -     CBC with Differential/Platelet  Acute low back pain without sciatica likely 2/2 muscle strain from tossing and turning and increased activity at work.  Discussed supportive care including heat, rest, stretching, topical analgesics, Tylenol or NSAIDs.  POC UA negative for UTI.  Given abdominal pain obtain CMP and CBC.  Check vitamin B12 for paresthesias.  A1c normal. Continue follow-up with oncology.  Return if symptoms worsen or fail to improve.   Deeann Saint, MD

## 2023-07-25 NOTE — Patient Instructions (Addendum)
You can use over-the-counter Biofreeze, IcyHot, or Aspercreme for your low back.  You can also use heat and do stretching exercises.  Your hemoglobin A1c was in the normal range.  This means you do not have diabetes or prediabetes.  Given your abdominal pain order were placed for CBC, vitamin B12, and a CMP.  This blood work to look at your kidney function, liver function, electrolytes, and blood counts.  Continue working on eating a healthy diet with less fast food and more vegetables.

## 2023-07-26 LAB — COMPREHENSIVE METABOLIC PANEL
AG Ratio: 1.6 (calc) (ref 1.0–2.5)
ALT: 11 U/L (ref 6–29)
AST: 15 U/L (ref 10–30)
Albumin: 4.4 g/dL (ref 3.6–5.1)
Alkaline phosphatase (APISO): 46 U/L (ref 31–125)
BUN: 10 mg/dL (ref 7–25)
CO2: 27 mmol/L (ref 20–32)
Calcium: 9.5 mg/dL (ref 8.6–10.2)
Chloride: 103 mmol/L (ref 98–110)
Creat: 0.64 mg/dL (ref 0.50–0.96)
Globulin: 2.8 g/dL (ref 1.9–3.7)
Glucose, Bld: 90 mg/dL (ref 65–99)
Potassium: 3.9 mmol/L (ref 3.5–5.3)
Sodium: 140 mmol/L (ref 135–146)
Total Bilirubin: 0.5 mg/dL (ref 0.2–1.2)
Total Protein: 7.2 g/dL (ref 6.1–8.1)

## 2023-07-26 LAB — CBC WITH DIFFERENTIAL/PLATELET
Absolute Lymphocytes: 2414 {cells}/uL (ref 850–3900)
Absolute Monocytes: 459 {cells}/uL (ref 200–950)
Basophils Absolute: 39 {cells}/uL (ref 0–200)
Basophils Relative: 0.7 %
Eosinophils Absolute: 73 {cells}/uL (ref 15–500)
Eosinophils Relative: 1.3 %
HCT: 42.4 % (ref 35.0–45.0)
Hemoglobin: 12.6 g/dL (ref 11.7–15.5)
MCH: 21.5 pg — ABNORMAL LOW (ref 27.0–33.0)
MCHC: 29.7 g/dL — ABNORMAL LOW (ref 32.0–36.0)
MCV: 72.4 fL — ABNORMAL LOW (ref 80.0–100.0)
MPV: 9.5 fL (ref 7.5–12.5)
Monocytes Relative: 8.2 %
Neutro Abs: 2615 {cells}/uL (ref 1500–7800)
Neutrophils Relative %: 46.7 %
Platelets: 375 10*3/uL (ref 140–400)
RBC: 5.86 10*6/uL — ABNORMAL HIGH (ref 3.80–5.10)
RDW: 24.1 % — ABNORMAL HIGH (ref 11.0–15.0)
Total Lymphocyte: 43.1 %
WBC: 5.6 10*3/uL (ref 3.8–10.8)

## 2023-07-26 LAB — VITAMIN B12: Vitamin B-12: 225 pg/mL (ref 200–1100)

## 2023-08-01 ENCOUNTER — Ambulatory Visit: Payer: Medicaid Other | Admitting: Family Medicine

## 2023-08-04 ENCOUNTER — Ambulatory Visit: Payer: Medicaid Other | Admitting: Family Medicine

## 2023-08-06 ENCOUNTER — Encounter: Payer: Self-pay | Admitting: Family Medicine

## 2023-08-06 ENCOUNTER — Ambulatory Visit (INDEPENDENT_AMBULATORY_CARE_PROVIDER_SITE_OTHER): Payer: Medicaid Other | Admitting: Family Medicine

## 2023-08-06 VITALS — BP 110/70 | HR 80 | Temp 98.1°F | Wt 191.0 lb

## 2023-08-06 DIAGNOSIS — N760 Acute vaginitis: Secondary | ICD-10-CM | POA: Diagnosis not present

## 2023-08-06 DIAGNOSIS — B9689 Other specified bacterial agents as the cause of diseases classified elsewhere: Secondary | ICD-10-CM | POA: Diagnosis not present

## 2023-08-06 MED ORDER — METRONIDAZOLE 500 MG PO TABS: 500.0000 mg | ORAL_TABLET | Freq: Three times a day (TID) | ORAL | 0 refills | Status: DC

## 2023-08-06 NOTE — Progress Notes (Signed)
   Subjective:    Patient ID: Cassie Garrett, female    DOB: 06-28-1996, 27 y.o.   MRN: [id number]  HPI Here for 3 days of a whitish vaginal DC that has a foul odor. No itching. She has a slight cramping pain in the lower abdomen. No fever. Her LMP was 2 weeks ago. She had these same symptoms a few years ago and it was diagnosed as BV.    Review of Systems  Constitutional: Negative.   Respiratory: Negative.    Cardiovascular: Negative.   Gastrointestinal:  Positive for abdominal pain. Negative for abdominal distention, blood in stool, constipation, diarrhea, nausea, rectal pain and vomiting.  Genitourinary:  Positive for vaginal discharge. Negative for dysuria, hematuria and pelvic pain.       Objective:   Physical Exam Constitutional:      Appearance: Normal appearance. She is not ill-appearing.  Cardiovascular:     Rate and Rhythm: Normal rate and regular rhythm.     Pulses: Normal pulses.     Heart sounds: Normal heart sounds.  Pulmonary:     Effort: Pulmonary effort is normal.     Breath sounds: Normal breath sounds.  Abdominal:     General: Abdomen is flat. Bowel sounds are normal. There is no distension.     Palpations: There is no mass.     Tenderness: There is no abdominal tenderness. There is no right CVA tenderness, left CVA tenderness, guarding or rebound.     Hernia: No hernia is present.  Neurological:     Mental Status: She is alert.           Assessment & Plan:  Bacterial vaginitis, treat with 7 days of Metronidazole.  Gershon Crane, MD

## 2023-08-13 ENCOUNTER — Ambulatory Visit: Payer: Medicaid Other | Admitting: Allergy

## 2023-08-13 NOTE — Patient Instructions (Signed)
Atopic dermatitis - Do a daily soaking tub bath in warm water for 10-15 minutes.  - Use a gentle, unscented cleanser at the end of the bath (such as Dove unscented bar or baby wash, or Aveeno sensitive body wash). Then rinse, pat half-way dry, and apply a gentle, unscented moisturizer cream or ointment (Cerave, Cetaphil, Eucerin, Aveeno)  all over while still damp. Dry skin makes the itching and rash of eczema worse. The skin should be moisturized with a gentle, unscented moisturizer at least twice daily.  - Use only unscented liquid laundry detergent. - Apply prescribed topical steroid (triamcinolone 0.1% below neck or hydrocortisone 2.5% above neck) to flared areas (red and thickened eczema) after the moisturizer has soaked into the skin (wait at least 30 minutes). Taper off the topical steroids as the skin improves. Do not use topical steroid for more than 7-10 days at a time.  - Put Protopic onto areas of rough eczema (that is not red) twice a day. May decrease to once a day as the eczema improves. This will not thin the skin, and is safe for chronic use. Do not put this onto normal appearing skin. - We will submit you for Dupixent for control of eczema  Chronic rhinitis Begin cetirizine 10 mg once a day as needed for a runny nose or itch Begin Flonase 2 sprays in each nostril once a day as needed for stuffy nose.  In the right nostril, point the applicator out toward the right ear. In the left nostril, point the applicator out toward the left ear Consider saline nasal rinses as needed for nasal symptoms. Use this before any medicated nasal sprays for best result A lab has been ordered to help [place] evaluate your environmental allergies.  We will call you when the results become available.  Call the clinic if this treatment plan is not working well for you.  Follow up in 3 months or sooner if needed.

## 2023-08-13 NOTE — Progress Notes (Signed)
   [address] [place] [address] Dept: [phone]  FOLLOW UP NOTE  Patient ID: Cassie Garrett, female    DOB: Jan 04, 1996  Age: 27 y.o. MRN: [id number] Date of Office Visit: 08/14/2023  Assessment  Chief Complaint: No chief complaint on file.  HPI Cassie Garrett is a 27 year old female who presents to the clinic for follow-up visit.  She was last seen in this clinic on 02/14/2023 by Dr. Allena Katz for evaluation of possible food allergy and atopic dermatitis.  Her last environmental allergy testing on 12/07/2021 was negative to mushroom and was deemed to be not an IgE food mediated allergy.  Patient was not interested in an office challenge to mushroom at that time.  Discussed the use of AI scribe software for clinical note transcription with the patient, who gave verbal consent to proceed.  History of Present Illness             Drug Allergies:  No Known Allergies  Physical Exam: LMP 07/12/2023 (Exact Date)    Physical Exam  Diagnostics:    Assessment and Plan: No diagnosis found.  No orders of the defined types were placed in this encounter.   There are no Patient Instructions on file for this visit.  No follow-ups on file.    Thank you for the opportunity to care for this patient.  Please do not hesitate to contact me with questions.  Thermon Leyland, FNP [facility]

## 2023-08-14 ENCOUNTER — Other Ambulatory Visit: Payer: Self-pay

## 2023-08-14 ENCOUNTER — Ambulatory Visit: Payer: Medicaid Other | Admitting: Family Medicine

## 2023-08-14 ENCOUNTER — Encounter: Payer: Self-pay | Admitting: Family Medicine

## 2023-08-14 VITALS — BP 118/60 | HR 89 | Temp 98.1°F | Resp 16 | Wt 191.5 lb

## 2023-08-14 DIAGNOSIS — J3089 Other allergic rhinitis: Secondary | ICD-10-CM | POA: Diagnosis not present

## 2023-08-14 DIAGNOSIS — L2084 Intrinsic (allergic) eczema: Secondary | ICD-10-CM

## 2023-08-14 DIAGNOSIS — J309 Allergic rhinitis, unspecified: Secondary | ICD-10-CM | POA: Insufficient documentation

## 2023-08-14 MED ORDER — TACROLIMUS 0.1 % EX OINT: TOPICAL_OINTMENT | Freq: Two times a day (BID) | CUTANEOUS | 5 refills | Status: DC

## 2023-08-14 MED ORDER — HYDROCORTISONE 2.5 % EX OINT: TOPICAL_OINTMENT | Freq: Two times a day (BID) | CUTANEOUS | 5 refills | Status: DC

## 2023-08-14 MED ORDER — TRIAMCINOLONE ACETONIDE 0.1 % EX OINT: TOPICAL_OINTMENT | CUTANEOUS | 5 refills | Status: DC

## 2023-08-15 ENCOUNTER — Other Ambulatory Visit: Payer: Self-pay

## 2023-08-15 ENCOUNTER — Other Ambulatory Visit (HOSPITAL_COMMUNITY): Payer: Self-pay

## 2023-08-15 ENCOUNTER — Telehealth: Payer: Self-pay | Admitting: *Deleted

## 2023-08-15 MED ORDER — DUPIXENT 300 MG/2ML ~~LOC~~ SOSY
600.0000 mg | PREFILLED_SYRINGE | Freq: Once | SUBCUTANEOUS | 11 refills | Status: AC
  Filled 2023-08-15: qty 4, 14d supply, fill #0
  Filled 2024-01-15: qty 4, 28d supply, fill #1
  Filled 2024-03-11 – 2024-03-15 (×2): qty 4, 28d supply, fill #2

## 2023-08-15 NOTE — Telephone Encounter (Signed)
L/m for patient to contact me to advise dupixent approval and submit to [facility]

## 2023-08-15 NOTE — Telephone Encounter (Signed)
Thank you :)

## 2023-08-15 NOTE — Telephone Encounter (Signed)
-----   Message from Thermon Leyland sent at 08/14/2023  1:36 PM EST ----- Hi Cassie Garrett, Can you please submit this patient to restart Dupixent injections for atopic dermatitis control. Her last injection was in the clinic on 11/27/2022. Thank you

## 2023-08-15 NOTE — Progress Notes (Signed)
Specialty Pharmacy Initial Fill Coordination Note  Cassie Garrett is a 27 y.o. female contacted today regarding initial fill of specialty medication(s) Dupilumab   Patient requested Courier to Provider Office   Delivery date: 08/19/23   Verified address: [address] [place] [address]   Medication will be filled on 12/09.   Patient is aware of $4.00 copayment.

## 2023-08-15 NOTE — Telephone Encounter (Signed)
Patient advised of submit will reach out for next dose once delivery set per pt preference in clinic admin

## 2023-08-15 NOTE — Progress Notes (Signed)
Specialty Pharmacy Initiation Note   Cassie Garrett is a 27 y.o. female who will be followed by the specialty pharmacy service for RxSp Atopic Dermatitis    Review of administration, indication, effectiveness, safety, potential side effects, storage/disposable, and missed dose instructions occurred today for patient's specialty medication(s) Dupilumab     Patient/Caregiver did not have any additional questions or concerns.   Patient's therapy is appropriate to: Initiate    Goals Addressed             This Visit's Progress    Reduce signs and symptoms       Patient is initiating therapy. Patient will maintain adherence. Patient therapy being initiated again in provider office due to flare after discontinuing previously.          Otto Herb Specialty Pharmacist

## 2023-08-18 ENCOUNTER — Other Ambulatory Visit: Payer: Self-pay

## 2023-08-18 LAB — ALLERGENS, ZONE 2

## 2023-08-18 NOTE — Progress Notes (Signed)
Can you please let this patient know that her lab work was negative to the environmental allergy panel. Pleas have her continue to follow the AVS for suggestions for management of chronic rhinitis. Thank you

## 2023-08-20 NOTE — Progress Notes (Signed)
[facility] [phone]   [phone]  PROGRESS NOTE  Patient Care Team: Deeann Saint, MD as PCP - General (Family Medicine)   CHIEF COMPLAINTS/PURPOSE OF CONSULTATION:  Iron deficiency anemia 2/2 menorrhagia  HISTORY OF PRESENTING ILLNESS:  Cassie Garrett 27 y.o. female presents for a follow up visit for iron deficiency anemia. She was last seen by Dr. Clelia Croft on 06/25/2023. She is unaccompanied for this visit.   On exam today, Ms. Booz reports ***. She denies fevers, chills, sweats, shortness of breath, chest pain, cough, headaches, dizziness, nausea, vomiting or bowel habit changes. She has no other complaints. Rest of the ROS is below.   MEDICAL HISTORY:  Past Medical History:  Diagnosis Date   Eczema    Iron deficiency     SURGICAL HISTORY: No past surgical history on file.  SOCIAL HISTORY: Social History   Socioeconomic History   Marital status: Single    Spouse name: Not on file   Number of children: Not on file   Years of education: Not on file   Highest education level: Not on file  Occupational History   Not on file  Tobacco Use   Smoking status: Never    Passive exposure: Current   Smokeless tobacco: Never  Vaping Use   Vaping status: Never Used  Substance and Sexual Activity   Alcohol use: No    Alcohol/week: 0.0 standard drinks of alcohol   Drug use: No   Sexual activity: Yes    Birth control/protection: Pill  Other Topics Concern   Not on file  Social History Narrative   Not on file   Social Determinants of Health   Financial Resource Strain: Not on file  Food Insecurity: Not on file  Transportation Needs: Not on file  Physical Activity: Not on file  Stress: Not on file  Social Connections: Unknown (01/07/2022)   Received from [facility], [facility]   Social Network    Social Network: Not on file  Intimate Partner Violence: Unknown (12/10/2021)   Received from [facility], [facility]    HITS    Physically Hurt: Not on file    Insult or Talk Down To: Not on file    Threaten Physical Harm: Not on file    Scream or Curse: Not on file    FAMILY HISTORY: No family history on file.  ALLERGIES:  has No Known Allergies.  MEDICATIONS:  Current Outpatient Medications  Medication Sig Dispense Refill   hydrocortisone 2.5 % ointment Apply topically 2 (two) times daily. 30 g 5   metroNIDAZOLE (FLAGYL) 500 MG tablet Take 1 tablet (500 mg total) by mouth 3 (three) times daily. 21 tablet 0   tacrolimus (PROTOPIC) 0.1 % ointment Apply topically 2 (two) times daily. 100 g 5   triamcinolone ointment (KENALOG) 0.1 % Apply twice daily for flare ups below neck, maximum 10 days. 80 g 5   Current Facility-Administered Medications  Medication Dose Route Frequency Provider Last Rate Last Admin   dupilumab (DUPIXENT) prefilled syringe 300 mg  300 mg Subcutaneous Q14 Days Marcelyn Bruins, MD   300 mg at 11/27/22 1012    REVIEW OF SYSTEMS:   Constitutional: ( - ) fevers, ( - )  chills , ( - ) night sweats Eyes: ( - ) blurriness of vision, ( - ) double vision, ( - ) watery eyes Ears, nose, mouth, throat, and face: ( - ) mucositis, ( - ) sore throat Respiratory: ( - )  cough, ( - ) dyspnea, ( - ) wheezes Cardiovascular: ( - ) palpitation, ( - ) chest discomfort, ( - ) lower extremity swelling Gastrointestinal:  ( - ) nausea, ( - ) heartburn, ( - ) change in bowel habits Skin: ( - ) abnormal skin rashes Lymphatics: ( - ) new lymphadenopathy, ( - ) easy bruising Neurological: ( - ) numbness, ( - ) tingling, ( - ) new weaknesses Behavioral/Psych: ( - ) mood change, ( - ) new changes  All other systems were reviewed with the patient and are negative.  PHYSICAL EXAMINATION: ECOG PERFORMANCE STATUS: 1 - Symptomatic but completely ambulatory  There were no vitals filed for this visit.  There were no vitals filed for this visit.   GENERAL: well appearing female in NAD  SKIN: skin  color, texture, turgor are normal, no rashes or significant lesions EYES: conjunctiva are pink and non-injected, sclera clear LUNGS: clear to auscultation and percussion with normal breathing effort HEART: regular rate & rhythm and no murmurs and no lower extremity edema Musculoskeletal: no cyanosis of digits and no clubbing  PSYCH: alert & oriented x 3, fluent speech NEURO: no focal motor/sensory deficits  LABORATORY DATA:  I have reviewed the data as listed    Latest Ref Rng & Units 07/25/2023    3:19 PM 06/25/2023    8:26 AM 03/27/2023    4:26 PM  CBC  WBC 3.8 - 10.8 Thousand/uL 5.6  6.3  6.9   Hemoglobin 11.7 - 15.5 g/dL 40.1  02.7  25.3   Hematocrit 35.0 - 45.0 % 42.4  35.9  35.8   Platelets 140 - 400 Thousand/uL 375  548  464.0        Latest Ref Rng & Units 07/25/2023    3:19 PM 04/14/2019   12:04 AM  CMP  Glucose 65 - 99 mg/dL 90  94   BUN 7 - 25 mg/dL 10  8   Creatinine 6.64 - 0.96 mg/dL 4.03  4.74   Sodium 259 - 146 mmol/L 140  139   Potassium 3.5 - 5.3 mmol/L 3.9  2.9   Chloride 98 - 110 mmol/L 103  103   CO2 20 - 32 mmol/L 27  27   Calcium 8.6 - 10.2 mg/dL 9.5  9.5   Total Protein 6.1 - 8.1 g/dL 7.2  8.3   Total Bilirubin 0.2 - 1.2 mg/dL 0.5  0.3   Alkaline Phos 38 - 126 U/L  49   AST 10 - 30 U/L 15  30   ALT 6 - 29 U/L 11  20     ASSESSMENT & PLAN Cassie Garrett is a 27 y.o. female who presents to the clinic for a follow up for iron deficiency anemia.   # Iron Deficiency Anemia 2/2 to GYN Bleeding -- Findings are consistent with iron deficiency anemia secondary to patient's menorrhagia --Encouraged her to follow-up with OB/GYN for better control of her menstrual cycles --Last received IV venofer 200 mg x 2 doses from 09/03/2022-09/14/2022.  --Labs today show *** --We will plan to proceed with IV iron therapy in order to help bolster the patient's blood counts --RTC in 8 weeks with repeat labs  No orders of the defined types were placed in this  encounter.   All questions were answered. The patient knows to call the clinic with any problems, questions or concerns.  I have spent a total of 30 minutes minutes of face-to-face and non-face-to-face time, preparing to see the patient,  performing a medically appropriate examination, counseling and educating the patient, ordering medications/tests/procedures, referring and communicating with other health care professionals, documenting clinical information in the electronic health record, independently interpreting results and communicating results to the patient, and care coordination.   Ulysees Barns, MD Department of Hematology/Oncology [facility] at [facility] Phone: [phone] Pager: [phone] Email: [email or web address]

## 2023-08-21 ENCOUNTER — Inpatient Hospital Stay: Payer: Medicaid Other | Admitting: Hematology and Oncology

## 2023-08-21 ENCOUNTER — Inpatient Hospital Stay: Payer: Medicaid Other | Attending: Physician Assistant

## 2023-08-21 DIAGNOSIS — D508 Other iron deficiency anemias: Secondary | ICD-10-CM

## 2023-08-21 DIAGNOSIS — D500 Iron deficiency anemia secondary to blood loss (chronic): Secondary | ICD-10-CM

## 2023-09-05 ENCOUNTER — Ambulatory Visit: Payer: Medicaid Other | Admitting: Nurse Practitioner

## 2023-09-08 ENCOUNTER — Ambulatory Visit: Payer: Medicaid Other | Admitting: Family Medicine

## 2023-09-09 ENCOUNTER — Ambulatory Visit: Payer: Medicaid Other | Admitting: Family Medicine

## 2023-09-12 ENCOUNTER — Telehealth: Payer: Self-pay | Admitting: Hematology and Oncology

## 2023-09-12 NOTE — Telephone Encounter (Signed)
Scheduled appointments per patients request via incoming call. Patient is aware of the made appointments.

## 2023-09-15 ENCOUNTER — Other Ambulatory Visit (HOSPITAL_COMMUNITY): Payer: Self-pay

## 2023-09-18 ENCOUNTER — Other Ambulatory Visit (HOSPITAL_COMMUNITY): Payer: Self-pay

## 2023-09-19 ENCOUNTER — Ambulatory Visit (INDEPENDENT_AMBULATORY_CARE_PROVIDER_SITE_OTHER): Payer: Medicaid Other | Admitting: *Deleted

## 2023-09-19 DIAGNOSIS — L209 Atopic dermatitis, unspecified: Secondary | ICD-10-CM | POA: Diagnosis not present

## 2023-09-19 MED ORDER — DUPILUMAB 300 MG/2ML ~~LOC~~ SOSY
600.0000 mg | PREFILLED_SYRINGE | Freq: Once | SUBCUTANEOUS | Status: AC
  Administered 2023-09-19: 600 mg via SUBCUTANEOUS

## 2023-09-19 NOTE — Progress Notes (Signed)
 Immunotherapy   Patient Details  Name: Cassie Garrett MRN: [id number] Date of Birth: 1996/06/08  09/19/2023  Cassie Garrett started injections for  Dupixent   Frequency: Every 14 days Epi-Pen: Not Required Consent signed and patient instructions given. Patient started Dupixent  today and received 600mg  loading dose, 300mg  in the LUA and 300mg  in the RUA. Patient waited 15 minutes in office and did not experience any issues.   Cassie Garrett 09/19/2023, 10:49 AM

## 2023-09-23 ENCOUNTER — Other Ambulatory Visit: Payer: Self-pay

## 2023-09-26 ENCOUNTER — Inpatient Hospital Stay: Payer: Medicaid Other | Attending: Physician Assistant

## 2023-09-26 ENCOUNTER — Other Ambulatory Visit: Payer: Self-pay

## 2023-09-26 ENCOUNTER — Inpatient Hospital Stay: Payer: Medicaid Other | Admitting: Hematology and Oncology

## 2023-09-26 NOTE — Progress Notes (Unsigned)
[facility] [phone]   [phone]  PROGRESS NOTE  Patient Care Team: Cassie Saint, MD as PCP - General (Family Medicine)   CHIEF COMPLAINTS/PURPOSE OF CONSULTATION:  Iron deficiency anemia 2/2 menorrhagia  HISTORY OF PRESENTING ILLNESS:  Cassie Garrett 28 y.o. female presents for a follow up visit for iron deficiency anemia. She was last seen by Cassie Garrett on 06/25/2023. She is unaccompanied for this visit.   On exam today, Cassie Garrett reports *** She denies fevers, chills, sweats, shortness of breath, chest pain, cough, headaches, dizziness, nausea, vomiting or bowel habit changes. She has no other complaints. Rest of the ROS is below.   MEDICAL HISTORY:  Past Medical History:  Diagnosis Date   Eczema    Iron deficiency     SURGICAL HISTORY: No past surgical history on file.  SOCIAL HISTORY: Social History   Socioeconomic History   Marital status: Single    Spouse name: Not on file   Number of children: Not on file   Years of education: Not on file   Highest education level: Not on file  Occupational History   Not on file  Tobacco Use   Smoking status: Never    Passive exposure: Current   Smokeless tobacco: Never  Vaping Use   Vaping status: Never Used  Substance and Sexual Activity   Alcohol use: No    Alcohol/week: 0.0 standard drinks of alcohol   Drug use: No   Sexual activity: Yes    Birth control/protection: Pill  Other Topics Concern   Not on file  Social History Narrative   Not on file   Social Drivers of Health   Financial Resource Strain: Not on file  Food Insecurity: Not on file  Transportation Needs: Not on file  Physical Activity: Not on file  Stress: Not on file  Social Connections: Unknown (01/07/2022)   Received from [facility], [facility]   Social Network    Social Network: Not on file  Intimate Partner Violence: Unknown (12/10/2021)   Received from [facility], [facility]    HITS    Physically Hurt: Not on file    Insult or Talk Down To: Not on file    Threaten Physical Harm: Not on file    Scream or Curse: Not on file    FAMILY HISTORY: No family history on file.  ALLERGIES:  has no known allergies.  MEDICATIONS:  Current Outpatient Medications  Medication Sig Dispense Refill   hydrocortisone 2.5 % ointment Apply topically 2 (two) times daily. 30 g 5   metroNIDAZOLE (FLAGYL) 500 MG tablet Take 1 tablet (500 mg total) by mouth 3 (three) times daily. 21 tablet 0   tacrolimus (PROTOPIC) 0.1 % ointment Apply topically 2 (two) times daily. 100 g 5   triamcinolone ointment (KENALOG) 0.1 % Apply twice daily for flare ups below neck, maximum 10 days. 80 g 5   Current Facility-Administered Medications  Medication Dose Route Frequency Provider Last Rate Last Admin   dupilumab (DUPIXENT) prefilled syringe 300 mg  300 mg Subcutaneous Q14 Days Cassie Bruins, MD   300 mg at 11/27/22 1012    REVIEW OF SYSTEMS:   Constitutional: ( - ) fevers, ( - )  chills , ( - ) night sweats Eyes: ( - ) blurriness of vision, ( - ) double vision, ( - ) watery eyes Ears, nose, mouth, throat, and face: ( - ) mucositis, ( - ) sore throat Respiratory: ( - )  cough, ( - ) dyspnea, ( - ) wheezes Cardiovascular: ( - ) palpitation, ( - ) chest discomfort, ( - ) lower extremity swelling Gastrointestinal:  ( - ) nausea, ( - ) heartburn, ( - ) change in bowel habits Skin: ( - ) abnormal skin rashes Lymphatics: ( - ) new lymphadenopathy, ( - ) easy bruising Neurological: ( - ) numbness, ( - ) tingling, ( - ) new weaknesses Behavioral/Psych: ( - ) mood change, ( - ) new changes  All other systems were reviewed with the patient and are negative.  PHYSICAL EXAMINATION: ECOG PERFORMANCE STATUS: 1 - Symptomatic but completely ambulatory  There were no vitals filed for this visit.  There were no vitals filed for this visit.   GENERAL: well appearing female in NAD  SKIN: skin  color, texture, turgor are normal, no rashes or significant lesions EYES: conjunctiva are pink and non-injected, sclera clear LUNGS: clear to auscultation and percussion with normal breathing effort HEART: regular rate & rhythm and no murmurs and no lower extremity edema Musculoskeletal: no cyanosis of digits and no clubbing  PSYCH: alert & oriented x 3, fluent speech NEURO: no focal motor/sensory deficits  LABORATORY DATA:  I have reviewed the data as listed    Latest Ref Rng & Units 07/25/2023    3:19 PM 06/25/2023    8:26 AM 03/27/2023    4:26 PM  CBC  WBC 3.8 - 10.8 Thousand/uL 5.6  6.3  6.9   Hemoglobin 11.7 - 15.5 g/dL 60.4  54.0  98.1   Hematocrit 35.0 - 45.0 % 42.4  35.9  35.8   Platelets 140 - 400 Thousand/uL 375  548  464.0        Latest Ref Rng & Units 07/25/2023    3:19 PM 04/14/2019   12:04 AM  CMP  Glucose 65 - 99 mg/dL 90  94   BUN 7 - 25 mg/dL 10  8   Creatinine 1.91 - 0.96 mg/dL 4.78  2.95   Sodium 621 - 146 mmol/L 140  139   Potassium 3.5 - 5.3 mmol/L 3.9  2.9   Chloride 98 - 110 mmol/L 103  103   CO2 20 - 32 mmol/L 27  27   Calcium 8.6 - 10.2 mg/dL 9.5  9.5   Total Protein 6.1 - 8.1 g/dL 7.2  8.3   Total Bilirubin 0.2 - 1.2 mg/dL 0.5  0.3   Alkaline Phos 38 - 126 U/L  49   AST 10 - 30 U/L 15  30   ALT 6 - 29 U/L 11  20     ASSESSMENT & PLAN Cassie Garrett is a 28 y.o. female who presents to the clinic for a follow up for iron deficiency anemia.   # Iron Deficiency Anemia 2/2 to GYN Bleeding -- Findings are consistent with iron deficiency anemia secondary to patient's menorrhagia --Encouraged her to follow-up with OB/GYN for better control of her menstrual cycles --Last received IV venofer 200 mg x 2 doses from 09/03/2022-09/14/2022.  --Labs today show *** --We will plan to proceed with IV iron therapy in order to help bolster the patient's blood counts --RTC ***  No orders of the defined types were placed in this encounter.   All questions  were answered. The patient knows to call the clinic with any problems, questions or concerns.  I have spent a total of 30 minutes minutes of face-to-face and non-face-to-face time, preparing to see the patient, performing a medically appropriate examination,  counseling and educating the patient, ordering medications/tests/procedures, referring and communicating with other health care professionals, documenting clinical information in the electronic health record, independently interpreting results and communicating results to the patient, and care coordination.   Ulysees Barns, MD Department of Hematology/Oncology [facility] at [facility] Phone: [phone] Pager: [phone] Email: [email or web address]

## 2023-10-02 ENCOUNTER — Ambulatory Visit: Payer: Medicaid Other | Admitting: Allergy

## 2023-10-03 ENCOUNTER — Ambulatory Visit: Payer: Medicaid Other

## 2023-10-07 ENCOUNTER — Ambulatory Visit: Payer: Self-pay | Admitting: Family Medicine

## 2023-10-07 NOTE — Telephone Encounter (Signed)
Copied from CRM [id number]. Topic: Clinical - Red Word Triage >> Oct 07, 2023  4:20 PM Denese Killings wrote: [facility] that prompted transfer to Nurse Triage: Patient is experiencing headaches everyday and she keeps getting hot.   Chief Complaint: Headache Symptoms: Headache, "I get hot and sweaty" Frequency: Intermittent  Pertinent Negatives: Patient denies any other symptoms currently  Disposition: [] ED /[] Urgent Care (no appt availability in office) / [x] Appointment(In office/virtual)/ []  [facility]/ [] Home Care/ [] Refused Recommended Disposition /[] [facility]/ []  Follow-up with PCP Additional Notes: Patient reports for the last 1-2 weeks she has been experiencing daily headaches. She rates her headaches as 5/10 and states that they are improved with OTC medication. Patient denies any history of similar headaches. Patient states she has also been feeling "hot and sweaty" intermittently. Patient denies any other symptom at this time. Appointment made for patient and patient instructed to call back for any new or worsening symptoms. Patient verbalized understanding and agreement with this plan.     Reason for Disposition  [1] MILD-MODERATE headache AND [2] present > 72 hours  Answer Assessment - Initial Assessment Questions 1. LOCATION: "Where does it hurt?"      Middle of head  2. ONSET: "When did the headache start?" (Minutes, hours or days)      1-2 weeks ago 3. PATTERN: "Does the pain come and go, or has it been constant since it started?"     Intermittent  4. SEVERITY: "How bad is the pain?" and "What does it keep you from doing?"  (e.g., Scale 1-10; mild, moderate, or severe)   - MILD (1-3): doesn't interfere with normal activities    - MODERATE (4-7): interferes with normal activities or awakens from sleep    - SEVERE (8-10): excruciating pain, unable to do any normal activities        5/10 5. RECURRENT SYMPTOM: "Have you ever had headaches before?" If Yes, ask:  "When was the last time?" and "What happened that time?"      No 6. CAUSE: "What do you think is causing the headache?"     Unsure  7. MIGRAINE: "Have you been diagnosed with migraine headaches?" If Yes, ask: "Is this headache similar?"      No 8. HEAD INJURY: "Has there been any recent injury to the head?"      No 9. OTHER SYMPTOMS: "Do you have any other symptoms?" (fever, stiff neck, eye pain, sore throat, cold symptoms)     Hot and sweaty intermittently  10. PREGNANCY: "Is there any chance you are pregnant?" "When was your last menstrual period?"       No  Protocols used: Headache-A-AH

## 2023-10-07 NOTE — Telephone Encounter (Signed)
Patient has an appt on 1/30

## 2023-10-09 ENCOUNTER — Ambulatory Visit: Payer: Medicaid Other | Admitting: Family Medicine

## 2023-10-10 ENCOUNTER — Ambulatory Visit: Payer: Medicaid Other | Admitting: Family Medicine

## 2023-10-29 ENCOUNTER — Ambulatory Visit (INDEPENDENT_AMBULATORY_CARE_PROVIDER_SITE_OTHER): Payer: Medicaid Other

## 2023-10-29 DIAGNOSIS — L209 Atopic dermatitis, unspecified: Secondary | ICD-10-CM | POA: Diagnosis not present

## 2023-11-12 ENCOUNTER — Ambulatory Visit: Payer: Medicaid Other | Admitting: Allergy

## 2023-11-12 ENCOUNTER — Ambulatory Visit: Payer: Medicaid Other | Admitting: *Deleted

## 2023-11-12 DIAGNOSIS — L209 Atopic dermatitis, unspecified: Secondary | ICD-10-CM | POA: Diagnosis not present

## 2023-11-20 NOTE — Progress Notes (Deleted)
 Follow Up Note  RE: Cassie Garrett MRN: [id number] DOB: 1995-12-29 Date of Office Visit: 11/21/2023  Referring provider: Deeann Saint, MD Primary care provider: Deeann Saint, MD  Chief Complaint: No chief complaint on file.  History of Present Illness: I had the pleasure of seeing Cassie Garrett for a follow up visit at the [facility] on 11/20/2023. She is a 28 y.o. female, who is being followed for AD, chronic rhinitis. Her previous allergy office visit was on 08/14/2023 with Thermon Leyland, FNP. Today is a regular follow up visit.  Discussed the use of AI scribe software for clinical note transcription with the patient, who gave verbal consent to proceed.  History of Present Illness            2024 labs negative to the environmental allergy panel.  Assessment and Plan: [name] is a 28 y.o. female with: Atopic dermatitis - Do a daily soaking tub bath in warm water for 10-15 minutes.  - Use a gentle, unscented cleanser at the end of the bath (such as Dove unscented bar or baby wash, or Aveeno sensitive body wash). Then rinse, pat half-way dry, and apply a gentle, unscented moisturizer cream or ointment (Cerave, Cetaphil, Eucerin, Aveeno)  all over while still damp. Dry skin makes the itching and rash of eczema worse. The skin should be moisturized with a gentle, unscented moisturizer at least twice daily.  - Use only unscented liquid laundry detergent. - Apply prescribed topical steroid (triamcinolone 0.1% below neck or hydrocortisone 2.5% above neck) to flared areas (red and thickened eczema) after the moisturizer has soaked into the skin (wait at least 30 minutes). Taper off the topical steroids as the skin improves. Do not use topical steroid for more than 7-10 days at a time.  - Put Protopic onto areas of rough eczema (that is not red) twice a day. May decrease to once a day as the eczema improves. This will not thin the skin, and is safe for chronic use. Do  not put this onto normal appearing skin. - We will submit you for Dupixent for control of eczema   Chronic rhinitis Begin cetirizine 10 mg once a day as needed for a runny nose or itch Begin Flonase 2 sprays in each nostril once a day as needed for stuffy nose.  In the right nostril, point the applicator out toward the right ear. In the left nostril, point the applicator out toward the left ear Consider saline nasal rinses as needed for nasal symptoms. Use this before any medicated nasal sprays for best result A lab has been ordered to help [place] evaluate your environmental allergies.  We will call you when the results become available.   Assessment and Plan              No follow-ups on file.  No orders of the defined types were placed in this encounter.  Lab Orders  No laboratory test(s) ordered today    Diagnostics: Spirometry:  Tracings reviewed. Her effort: {Blank single:19197::"Good reproducible efforts.","It was hard to get consistent efforts and there is a question as to whether this reflects a maximal maneuver.","Poor effort, data can not be interpreted."} FVC: ***L FEV1: ***L, ***% predicted FEV1/FVC ratio: ***% Interpretation: {Blank single:19197::"Spirometry consistent with mild obstructive disease","Spirometry consistent with moderate obstructive disease","Spirometry consistent with severe obstructive disease","Spirometry consistent with possible restrictive disease","Spirometry consistent with mixed obstructive and restrictive disease","Spirometry uninterpretable due to technique","Spirometry consistent with normal pattern","No overt abnormalities noted  given today's efforts"}.  Please see scanned spirometry results for details.  Skin Testing: {Blank single:19197::"Select foods","Environmental allergy panel","Environmental allergy panel and select foods","Food allergy panel","None","Deferred due to recent antihistamines use"}. *** Results discussed with  patient/family.   Medication List:  Current Outpatient Medications  Medication Sig Dispense Refill   hydrocortisone 2.5 % ointment Apply topically 2 (two) times daily. 30 g 5   metroNIDAZOLE (FLAGYL) 500 MG tablet Take 1 tablet (500 mg total) by mouth 3 (three) times daily. 21 tablet 0   tacrolimus (PROTOPIC) 0.1 % ointment Apply topically 2 (two) times daily. 100 g 5   triamcinolone ointment (KENALOG) 0.1 % Apply twice daily for flare ups below neck, maximum 10 days. 80 g 5   Current Facility-Administered Medications  Medication Dose Route Frequency Provider Last Rate Last Admin   dupilumab (DUPIXENT) prefilled syringe 300 mg  300 mg Subcutaneous Q14 Days Marcelyn Bruins, MD   300 mg at 11/12/23 1531   Allergies: No Known Allergies I reviewed her past medical history, social history, family history, and environmental history and no significant changes have been reported from her previous visit.  Review of Systems  Constitutional:  Negative for appetite change, chills, fever and unexpected weight change.  HENT:  Negative for congestion and rhinorrhea.   Eyes:  Negative for itching.  Respiratory:  Negative for cough, chest tightness, shortness of breath and wheezing.   Cardiovascular:  Negative for chest pain.  Gastrointestinal:  Negative for abdominal pain.  Genitourinary:  Negative for difficulty urinating.  Skin:  Negative for rash.  Neurological:  Negative for headaches.    Objective: There were no vitals taken for this visit. There is no height or weight on file to calculate BMI. Physical Exam Vitals and nursing note reviewed.  Constitutional:      Appearance: Normal appearance. She is well-developed.  HENT:     Head: Normocephalic and atraumatic.     Right Ear: Tympanic membrane and external ear normal.     Left Ear: Tympanic membrane and external ear normal.     Nose: Nose normal.     Mouth/Throat:     Mouth: Mucous membranes are moist.     Pharynx:  Oropharynx is clear.  Eyes:     Conjunctiva/sclera: Conjunctivae normal.  Cardiovascular:     Rate and Rhythm: Normal rate and regular rhythm.     Heart sounds: Normal heart sounds. No murmur heard.    No friction rub. No gallop.  Pulmonary:     Effort: Pulmonary effort is normal.     Breath sounds: Normal breath sounds. No wheezing, rhonchi or rales.  Musculoskeletal:     Cervical back: Neck supple.  Skin:    General: Skin is warm.     Findings: No rash.  Neurological:     Mental Status: She is alert and oriented to person, place, and time.  Psychiatric:        Behavior: Behavior normal.    Previous notes and tests were reviewed. The plan was reviewed with the patient/family, and all questions/concerned were addressed.  It was my pleasure to see Kristyl today and participate in her care. Please feel free to contact me with any questions or concerns.  Sincerely,  Wyline Mood, DO Allergy & Immunology  [facility] office: [phone] [facility] office: [phone]

## 2023-11-21 ENCOUNTER — Ambulatory Visit: Admitting: Allergy

## 2023-11-26 ENCOUNTER — Ambulatory Visit: Admitting: *Deleted

## 2023-11-26 ENCOUNTER — Ambulatory Visit: Admitting: Family Medicine

## 2023-11-26 DIAGNOSIS — L209 Atopic dermatitis, unspecified: Secondary | ICD-10-CM

## 2023-11-27 ENCOUNTER — Ambulatory Visit: Admitting: Family Medicine

## 2023-11-27 NOTE — Progress Notes (Unsigned)
 Follow Up Note  RE: Coretta Leisey MRN: [id number] DOB: 15-Dec-1995 Date of Office Visit: 11/28/2023  Referring provider: Deeann Saint, MD Primary care provider: Deeann Saint, MD  Chief Complaint: No chief complaint on file.  History of Present Illness: I had the pleasure of seeing Jerah Esty for a follow up visit at the [facility] on 11/27/2023. She is a 28 y.o. female, who is being followed for AD, chronic rhinitis. Her previous allergy office visit was on 08/14/2023 with Thermon Leyland, FNP. Today is a regular follow up visit.  Discussed the use of AI scribe software for clinical note transcription with the patient, who gave verbal consent to proceed.  History of Present Illness            2024 labs negative to the environmental allergy panel.  Assessment and Plan: Suella is a 28 y.o. female with: Atopic dermatitis - Do a daily soaking tub bath in warm water for 10-15 minutes.  - Use a gentle, unscented cleanser at the end of the bath (such as Dove unscented bar or baby wash, or Aveeno sensitive body wash). Then rinse, pat half-way dry, and apply a gentle, unscented moisturizer cream or ointment (Cerave, Cetaphil, Eucerin, Aveeno)  all over while still damp. Dry skin makes the itching and rash of eczema worse. The skin should be moisturized with a gentle, unscented moisturizer at least twice daily.  - Use only unscented liquid laundry detergent. - Apply prescribed topical steroid (triamcinolone 0.1% below neck or hydrocortisone 2.5% above neck) to flared areas (red and thickened eczema) after the moisturizer has soaked into the skin (wait at least 30 minutes). Taper off the topical steroids as the skin improves. Do not use topical steroid for more than 7-10 days at a time.  - Put Protopic onto areas of rough eczema (that is not red) twice a day. May decrease to once a day as the eczema improves. This will not thin the skin, and is safe for chronic use. Do  not put this onto normal appearing skin. - We will submit you for Dupixent for control of eczema   Chronic rhinitis Begin cetirizine 10 mg once a day as needed for a runny nose or itch Begin Flonase 2 sprays in each nostril once a day as needed for stuffy nose.  In the right nostril, point the applicator out toward the right ear. In the left nostril, point the applicator out toward the left ear Consider saline nasal rinses as needed for nasal symptoms. Use this before any medicated nasal sprays for best result A lab has been ordered to help [place] evaluate your environmental allergies.  We will call you when the results become available.   Assessment and Plan              No follow-ups on file.  No orders of the defined types were placed in this encounter.  Lab Orders  No laboratory test(s) ordered today    Diagnostics: Spirometry:  Tracings reviewed. Her effort: {Blank single:19197::"Good reproducible efforts.","It was hard to get consistent efforts and there is a question as to whether this reflects a maximal maneuver.","Poor effort, data can not be interpreted."} FVC: ***L FEV1: ***L, ***% predicted FEV1/FVC ratio: ***% Interpretation: {Blank single:19197::"Spirometry consistent with mild obstructive disease","Spirometry consistent with moderate obstructive disease","Spirometry consistent with severe obstructive disease","Spirometry consistent with possible restrictive disease","Spirometry consistent with mixed obstructive and restrictive disease","Spirometry uninterpretable due to technique","Spirometry consistent with normal pattern","No overt abnormalities noted  given today's efforts"}.  Please see scanned spirometry results for details.  Skin Testing: {Blank single:19197::"Select foods","Environmental allergy panel","Environmental allergy panel and select foods","Food allergy panel","None","Deferred due to recent antihistamines use"}. *** Results discussed with  patient/family.   Medication List:  Current Outpatient Medications  Medication Sig Dispense Refill   hydrocortisone 2.5 % ointment Apply topically 2 (two) times daily. 30 g 5   metroNIDAZOLE (FLAGYL) 500 MG tablet Take 1 tablet (500 mg total) by mouth 3 (three) times daily. 21 tablet 0   tacrolimus (PROTOPIC) 0.1 % ointment Apply topically 2 (two) times daily. 100 g 5   triamcinolone ointment (KENALOG) 0.1 % Apply twice daily for flare ups below neck, maximum 10 days. 80 g 5   Current Facility-Administered Medications  Medication Dose Route Frequency Provider Last Rate Last Admin   dupilumab (DUPIXENT) prefilled syringe 300 mg  300 mg Subcutaneous Q14 Days Marcelyn Bruins, MD   300 mg at 11/26/23 1432   Allergies: No Known Allergies I reviewed her past medical history, social history, family history, and environmental history and no significant changes have been reported from her previous visit.  Review of Systems  Constitutional:  Negative for appetite change, chills, fever and unexpected weight change.  HENT:  Negative for congestion and rhinorrhea.   Eyes:  Negative for itching.  Respiratory:  Negative for cough, chest tightness, shortness of breath and wheezing.   Cardiovascular:  Negative for chest pain.  Gastrointestinal:  Negative for abdominal pain.  Genitourinary:  Negative for difficulty urinating.  Skin:  Negative for rash.  Neurological:  Negative for headaches.    Objective: There were no vitals taken for this visit. There is no height or weight on file to calculate BMI. Physical Exam Vitals and nursing note reviewed.  Constitutional:      Appearance: Normal appearance. She is well-developed.  HENT:     Head: Normocephalic and atraumatic.     Right Ear: Tympanic membrane and external ear normal.     Left Ear: Tympanic membrane and external ear normal.     Nose: Nose normal.     Mouth/Throat:     Mouth: Mucous membranes are moist.     Pharynx:  Oropharynx is clear.  Eyes:     Conjunctiva/sclera: Conjunctivae normal.  Cardiovascular:     Rate and Rhythm: Normal rate and regular rhythm.     Heart sounds: Normal heart sounds. No murmur heard.    No friction rub. No gallop.  Pulmonary:     Effort: Pulmonary effort is normal.     Breath sounds: Normal breath sounds. No wheezing, rhonchi or rales.  Musculoskeletal:     Cervical back: Neck supple.  Skin:    General: Skin is warm.     Findings: No rash.  Neurological:     Mental Status: She is alert and oriented to person, place, and time.  Psychiatric:        Behavior: Behavior normal.    Previous notes and tests were reviewed. The plan was reviewed with the patient/family, and all questions/concerned were addressed.  It was my pleasure to see Rudi today and participate in her care. Please feel free to contact me with any questions or concerns.  Sincerely,  Wyline Mood, DO Allergy & Immunology  [facility] office: [phone] [facility] office: [phone]

## 2023-11-28 ENCOUNTER — Encounter: Payer: Self-pay | Admitting: Allergy

## 2023-11-28 ENCOUNTER — Ambulatory Visit: Admitting: Allergy

## 2023-11-28 ENCOUNTER — Other Ambulatory Visit: Payer: Self-pay

## 2023-11-28 VITALS — BP 108/74 | HR 99 | Temp 98.0°F | Resp 16

## 2023-11-28 DIAGNOSIS — J310 Chronic rhinitis: Secondary | ICD-10-CM | POA: Diagnosis not present

## 2023-11-28 DIAGNOSIS — L2089 Other atopic dermatitis: Secondary | ICD-10-CM | POA: Diagnosis not present

## 2023-11-28 MED ORDER — TACROLIMUS 0.1 % EX OINT: TOPICAL_OINTMENT | Freq: Two times a day (BID) | CUTANEOUS | 2 refills | Status: AC | PRN

## 2023-11-28 MED ORDER — TRIAMCINOLONE ACETONIDE 0.1 % EX OINT: 1.0000 | TOPICAL_OINTMENT | Freq: Two times a day (BID) | CUTANEOUS | 2 refills | Status: DC | PRN

## 2023-11-28 NOTE — Patient Instructions (Addendum)
 Skin  Keep track of rashes and take pictures. Write down what you had done/eaten during flares.  See below for proper skin care. No perfumes.  Use fragrance free and dye free products. No dryer sheets or fabric softener.    Use triamcinolone 0.1% ointment twice a day as needed for rash flares. Do not use on the face, neck, armpits or groin area. Do not use more than 3 weeks in a row.  Use protopic twice a day as needed.  Use over the counter antihistamines such as Zyrtec (cetirizine), Claritin (loratadine), Allegra (fexofenadine), or Xyzal (levocetirizine) daily as needed for itching.  Continue dupixent injections every 2 weeks - if no improvement at the next visit will consider switching to a different injectable or oral medications such as rinvoq - handout given.   Follow up in 2 months or sooner if needed.  Skin care recommendations  Bath time: Always use lukewarm water. AVOID very hot or cold water. Keep bathing time to 5-10 minutes. Do NOT use bubble bath. Use a mild soap and use just enough to wash the dirty areas. Do NOT scrub skin vigorously.  After bathing, pat dry your skin with a towel. Do NOT rub or scrub the skin.  Moisturizers and prescriptions:  ALWAYS apply moisturizers immediately after bathing (within 3 minutes). This helps to lock-in moisture. Use the moisturizer several times a day over the whole body. Good summer moisturizers include: Aveeno, CeraVe, Cetaphil. Good winter moisturizers include: Aquaphor, Vaseline, Cerave, Cetaphil, Eucerin, Vanicream. When using moisturizers along with medications, the moisturizer should be applied about one hour after applying the medication to prevent diluting effect of the medication or moisturize around where you applied the medications. When not using medications, the moisturizer can be continued twice daily as maintenance.  Laundry and clothing: Avoid laundry products with added color or perfumes. Use unscented  hypo-allergenic laundry products such as Tide free, Cheer free & gentle, and All free and clear.  If the skin still seems dry or sensitive, you can try double-rinsing the clothes. Avoid tight or scratchy clothing such as wool. Do not use fabric softeners or dyer sheets.

## 2023-12-05 ENCOUNTER — Other Ambulatory Visit: Payer: Self-pay

## 2023-12-08 ENCOUNTER — Other Ambulatory Visit: Payer: Self-pay

## 2023-12-08 ENCOUNTER — Telehealth: Payer: Self-pay

## 2023-12-08 NOTE — Telephone Encounter (Signed)
 Is patient using samples from office now? We have only sent 1 box 08/2023 but the patient has had multiple injections since then.

## 2023-12-10 ENCOUNTER — Other Ambulatory Visit: Payer: Self-pay

## 2023-12-15 ENCOUNTER — Encounter: Payer: Medicaid Other | Admitting: Advanced Practice Midwife

## 2023-12-17 ENCOUNTER — Ambulatory Visit

## 2024-01-04 ENCOUNTER — Other Ambulatory Visit: Payer: Self-pay | Admitting: Family Medicine

## 2024-01-04 DIAGNOSIS — L308 Other specified dermatitis: Secondary | ICD-10-CM

## 2024-01-08 ENCOUNTER — Encounter: Admitting: Family Medicine

## 2024-01-12 ENCOUNTER — Other Ambulatory Visit: Payer: Self-pay | Admitting: Physician Assistant

## 2024-01-12 DIAGNOSIS — D500 Iron deficiency anemia secondary to blood loss (chronic): Secondary | ICD-10-CM

## 2024-01-13 ENCOUNTER — Inpatient Hospital Stay: Attending: Physician Assistant

## 2024-01-13 ENCOUNTER — Inpatient Hospital Stay (HOSPITAL_BASED_OUTPATIENT_CLINIC_OR_DEPARTMENT_OTHER): Admitting: Physician Assistant

## 2024-01-13 ENCOUNTER — Encounter: Payer: Self-pay | Admitting: Physician Assistant

## 2024-01-13 VITALS — BP 126/75 | HR 104 | Temp 97.2°F | Resp 16 | Wt 188.2 lb

## 2024-01-13 DIAGNOSIS — D500 Iron deficiency anemia secondary to blood loss (chronic): Secondary | ICD-10-CM

## 2024-01-13 DIAGNOSIS — N920 Excessive and frequent menstruation with regular cycle: Secondary | ICD-10-CM | POA: Diagnosis not present

## 2024-01-13 DIAGNOSIS — D509 Iron deficiency anemia, unspecified: Secondary | ICD-10-CM | POA: Insufficient documentation

## 2024-01-13 DIAGNOSIS — Z79899 Other long term (current) drug therapy: Secondary | ICD-10-CM | POA: Diagnosis not present

## 2024-01-13 LAB — CBC WITH DIFFERENTIAL (CANCER CENTER ONLY)
Abs Immature Granulocytes: 0.01 10*3/uL (ref 0.00–0.07)
Basophils Absolute: 0 10*3/uL (ref 0.0–0.1)
Basophils Relative: 1 %
Eosinophils Absolute: 0.1 10*3/uL (ref 0.0–0.5)
Eosinophils Relative: 1 %
HCT: 43.4 % (ref 36.0–46.0)
Hemoglobin: 14.3 g/dL (ref 12.0–15.0)
Immature Granulocytes: 0 %
Lymphocytes Relative: 38 %
Lymphs Abs: 2 10*3/uL (ref 0.7–4.0)
MCH: 24.6 pg — ABNORMAL LOW (ref 26.0–34.0)
MCHC: 32.9 g/dL (ref 30.0–36.0)
MCV: 74.7 fL — ABNORMAL LOW (ref 80.0–100.0)
Monocytes Absolute: 0.5 10*3/uL (ref 0.1–1.0)
Monocytes Relative: 9 %
Neutro Abs: 2.8 10*3/uL (ref 1.7–7.7)
Neutrophils Relative %: 51 %
Platelet Count: 302 10*3/uL (ref 150–400)
RBC: 5.81 MIL/uL — ABNORMAL HIGH (ref 3.87–5.11)
RDW: 15.5 % (ref 11.5–15.5)
WBC Count: 5.4 10*3/uL (ref 4.0–10.5)
nRBC: 0 % (ref 0.0–0.2)

## 2024-01-13 LAB — IRON AND IRON BINDING CAPACITY (CC-WL,HP ONLY)
Iron: 38 ug/dL (ref 28–170)
Saturation Ratios: 9 % — ABNORMAL LOW (ref 10.4–31.8)
TIBC: 442 ug/dL (ref 250–450)
UIBC: 404 ug/dL (ref 148–442)

## 2024-01-13 LAB — FERRITIN: Ferritin: 9 ng/mL — ABNORMAL LOW (ref 11–307)

## 2024-01-13 NOTE — Progress Notes (Signed)
 [facility] Telephone:([phone]   [phone]  PROGRESS NOTE  Patient Care Team: Viola Greulich, MD as PCP - General (Family Medicine)   CHIEF COMPLAINTS/PURPOSE OF CONSULTATION:  Iron  deficiency anemia 2/2 menorrhagia  HISTORY OF PRESENTING ILLNESS:  Cassie Garrett 28 y.o. female presents for a follow up visit for iron  deficiency anemia. She was last seen on 06/25/2023. In the interim, she denies any changes to her health. She is unaccompanied for this visit.   On exam today, Cassie Garrett reports energy levels have started to decline recently. She continues to complete her ADLs on her own. She continues to have her monthly menstrual cycles last 7 days with 2 days of heavy bleeding. She previously tried hormonal pills but discontinued due development of facial acne. She plans to switch to a hormonal patch in the near future. She denies any ice or corn starch craving. She denies nausea, vomiting or bowel habit changes. She denies fevers, chills, sweats, shortness of breath, chest pain, cough, headaches or  dizziness. She has no other complaints. Rest of the ROS is below.   MEDICAL HISTORY:  Past Medical History:  Diagnosis Date   Eczema    Iron  deficiency     SURGICAL HISTORY: No past surgical history on file.  SOCIAL HISTORY: Social History   Socioeconomic History   Marital status: Single    Spouse name: Not on file   Number of children: Not on file   Years of education: Not on file   Highest education level: Not on file  Occupational History   Not on file  Tobacco Use   Smoking status: Never    Passive exposure: Current   Smokeless tobacco: Never  Vaping Use   Vaping status: Never Used  Substance and Sexual Activity   Alcohol use: No    Alcohol/week: 0.0 standard drinks of alcohol   Drug use: No   Sexual activity: Yes    Birth control/protection: Pill  Other Topics Concern   Not on file  Social History Narrative   Not on file    Social Drivers of Health   Financial Resource Strain: Not on file  Food Insecurity: Not on file  Transportation Needs: Not on file  Physical Activity: Not on file  Stress: Not on file  Social Connections: Unknown (01/07/2022)   Received from [facility], [facility]   Social Network    Social Network: Not on file  Intimate Partner Violence: Unknown (12/10/2021)   Received from [facility], [facility]   HITS    Physically Hurt: Not on file    Insult or Talk Down To: Not on file    Threaten Physical Harm: Not on file    Scream or Curse: Not on file    FAMILY HISTORY: No family history on file.  ALLERGIES:  has no known allergies.  MEDICATIONS:  Current Outpatient Medications  Medication Sig Dispense Refill   hydrocortisone  2.5 % ointment Apply topically 2 (two) times daily. 30 g 5   tacrolimus  (PROTOPIC ) 0.1 % ointment Apply topically 2 (two) times daily as needed (eczema spots). 100 g 2   triamcinolone  ointment (KENALOG ) 0.1 % Apply 1 Application topically 2 (two) times daily as needed (rash flare). Do not use on the face, neck, armpits or groin area. Do not use more than 3 weeks in a row. 80 g 2   Current Facility-Administered Medications  Medication Dose Route Frequency Provider Last Rate Last Admin   dupilumab  (DUPIXENT ) prefilled syringe  300 mg  300 mg Subcutaneous Q14 Days Brian Campanile, MD   300 mg at 11/26/23 1432    REVIEW OF SYSTEMS:   Constitutional: ( - ) fevers, ( - )  chills , ( - ) night sweats Eyes: ( - ) blurriness of vision, ( - ) double vision, ( - ) watery eyes Ears, nose, mouth, throat, and face: ( - ) mucositis, ( - ) sore throat Respiratory: ( - ) cough, ( - ) dyspnea, ( - ) wheezes Cardiovascular: ( - ) palpitation, ( - ) chest discomfort, ( - ) lower extremity swelling Gastrointestinal:  ( - ) nausea, ( - ) heartburn, ( - ) change in bowel habits Skin: ( - ) abnormal skin rashes Lymphatics: ( - ) new lymphadenopathy, ( - ) easy  bruising Neurological: ( - ) numbness, ( - ) tingling, ( - ) new weaknesses Behavioral/Psych: ( - ) mood change, ( - ) new changes  All other systems were reviewed with the patient and are negative.  PHYSICAL EXAMINATION: ECOG PERFORMANCE STATUS: 1 - Symptomatic but completely ambulatory  Vitals:   01/13/24 1117  BP: 126/75  Pulse: (!) 104  Resp: 16  Temp: (!) 97.2 F (36.2 C)  SpO2: 98%   Filed Weights   01/13/24 1117  Weight: 188 lb 3.2 oz (85.4 kg)    GENERAL: well appearing female in NAD  SKIN: skin color, texture, turgor are normal, no rashes or significant lesions EYES: conjunctiva are pink and non-injected, sclera clear LUNGS: clear to auscultation and percussion with normal breathing effort HEART: regular rate & rhythm and no murmurs and no lower extremity edema Musculoskeletal: no cyanosis of digits and no clubbing  PSYCH: alert & oriented x 3, fluent speech NEURO: no focal motor/sensory deficits  LABORATORY DATA:  I have reviewed the data as listed    Latest Ref Rng & Units 01/13/2024   10:36 AM 07/25/2023    3:19 PM 06/25/2023    8:26 AM  CBC  WBC 4.0 - 10.5 K/uL 5.4  5.6  6.3   Hemoglobin 12.0 - 15.0 g/dL 56.2  13.0  86.5   Hematocrit 36.0 - 46.0 % 43.4  42.4  35.9   Platelets 150 - 400 K/uL 302  375  548        Latest Ref Rng & Units 07/25/2023    3:19 PM 04/14/2019   12:04 AM  CMP  Glucose 65 - 99 mg/dL 90  94   BUN 7 - 25 mg/dL 10  8   Creatinine 7.84 - 0.96 mg/dL 6.96  2.95   Sodium 284 - 146 mmol/L 140  139   Potassium 3.5 - 5.3 mmol/L 3.9  2.9   Chloride 98 - 110 mmol/L 103  103   CO2 20 - 32 mmol/L 27  27   Calcium 8.6 - 10.2 mg/dL 9.5  9.5   Total Protein 6.1 - 8.1 g/dL 7.2  8.3   Total Bilirubin 0.2 - 1.2 mg/dL 0.5  0.3   Alkaline Phos 38 - 126 U/L  49   AST 10 - 30 U/L 15  30   ALT 6 - 29 U/L 11  20     ASSESSMENT & PLAN Cassie Garrett is a 28 y.o. female who presents to the clinic for a follow up for iron  deficiency anemia.    # Iron  Deficiency Anemia 2/2 to GYN Bleeding --Findings are consistent with iron  deficiency anemia secondary to patient's menorrhagia --Under the care  of OB/GYN with plans to start hormonal patch to better control her menstrual cycles.  --Does not take PO iron  due to questionable absorption.  --Last received monoferric  1000 mg x 1 dose on 07/02/2023.  --Labs today show no evidence of anemia with Hgb 14.3 but microcytosis with MCV 74.7. Iron  panel shows deficiency with saturation 9%, ferritin 9. --We will plan to proceed with IV iron  therapy in order to help bolster the patient's blood counts --RTC in 3 months with labs and 6 months with labs and follow up.   No orders of the defined types were placed in this encounter.   All questions were answered. The patient knows to call the clinic with any problems, questions or concerns.  I have spent a total of 30 minutes minutes of face-to-face and non-face-to-face time, preparing to see the patient, performing a medically appropriate examination, counseling and educating the patient, ordering medications/tests/procedures, documenting clinical information in the electronic health record, independently interpreting results and communicating results to the patient, and care coordination.   Wyline Hearing, PA-C Department of Hematology/Oncology [facility] Phone: [phone]

## 2024-01-14 ENCOUNTER — Telehealth: Payer: Self-pay

## 2024-01-14 ENCOUNTER — Other Ambulatory Visit (HOSPITAL_COMMUNITY): Payer: Self-pay | Admitting: Physician Assistant

## 2024-01-14 DIAGNOSIS — D500 Iron deficiency anemia secondary to blood loss (chronic): Secondary | ICD-10-CM

## 2024-01-14 NOTE — Addendum Note (Signed)
 Addended by: Welles Walthall D on: 01/14/2024 02:50 PM   Modules accepted: Orders

## 2024-01-14 NOTE — Telephone Encounter (Signed)
 Auth Submission: NO AUTH NEEDED Site of care: Site of care: [facility] Payer: [place] MEDICAID HE[place] BLUE  Medication & CPT/J Code(s) submitted: Monoferric  (Ferrci derisomaltose) [phone] Route of submission (phone, fax, portal): phone Phone # Fax # Auth type: Buy/Bill HB Units/visits requested: 1000MG , 1 DOSE Reference number:  Approval from: 01/14/24 to 07/16/24

## 2024-01-15 ENCOUNTER — Other Ambulatory Visit: Payer: Self-pay

## 2024-01-15 NOTE — Progress Notes (Signed)
 Specialty Pharmacy Refill Coordination Note  Cassie Garrett is a 28 y.o. female contacted today regarding refills of specialty medication(s) Dupilumab  (DUPIXENT )   Patient requested Courier to Provider Office   Delivery date: 01/19/24   Verified address: [address] [place] [address]   Medication will be filled on 01/16/24.

## 2024-01-16 ENCOUNTER — Other Ambulatory Visit: Payer: Self-pay

## 2024-01-16 NOTE — Progress Notes (Signed)
 Patient was left a voice mail that new payment information is needed. A Mychart message was sent as well.

## 2024-01-19 ENCOUNTER — Ambulatory Visit

## 2024-01-20 ENCOUNTER — Telehealth: Payer: Self-pay

## 2024-01-20 ENCOUNTER — Inpatient Hospital Stay (HOSPITAL_COMMUNITY): Admission: RE | Admit: 2024-01-20 | Source: Ambulatory Visit

## 2024-01-20 ENCOUNTER — Ambulatory Visit

## 2024-01-20 ENCOUNTER — Other Ambulatory Visit (HOSPITAL_COMMUNITY): Payer: Self-pay

## 2024-01-20 NOTE — Telephone Encounter (Signed)
 Approved today by [facility]  Medicaid [place] Case: [id number], Status: Approved, Coverage Starts on: 01/20/2024 12:00:00 AM, Coverage Ends on: 01/19/2025 12:00:00 AM. Effective Date: 01/20/2024 Authorization Expiration Date: 01/19/2025

## 2024-01-20 NOTE — Telephone Encounter (Signed)
*  Asthma/Allergy  Pharmacy Patient Advocate Encounter   Received notification from CoverMyMeds that prior authorization for Tacrolimus  0.1% ointment  is required/requested.   Insurance verification completed.   The patient is insured through [facility] .   Per test claim: PA required; PA submitted to above mentioned insurance via CoverMyMeds Key/confirmation #/EOC [id number] Status is pending

## 2024-01-27 NOTE — Progress Notes (Deleted)
 Follow Up Note  RE: Cassie Garrett MRN: [id number] DOB: 11-19-1995 Date of Office Visit: 01/28/2024  Referring provider: Viola Greulich, MD Primary care provider: Viola Greulich, MD  Chief Complaint: No chief complaint on file.  History of Present Illness: I had the pleasure of seeing Cassie Garrett for a follow up visit at the [facility] on 01/27/2024. She is a 28 y.o. female, who is being followed for atopic dermatitis and chronic rhinitis. Her previous allergy office visit was on 11/28/2023 with Dr. Burdette Carolin. Today is a regular follow up visit.  Discussed the use of AI scribe software for clinical note transcription with the patient, who gave verbal consent to proceed.  History of Present Illness            ***  Assessment and Plan: Cassie Garrett is a 28 y.o. female with: Other atopic dermatitis Chronic eczema with new patches. Partial improvement with Dupixent  previously. Lapse in treatment and restarted in February with no clear benefit. Keep track of rashes and take pictures. Write down what you had done/eaten during flares.  See below for proper skin care. No perfumes.  Use fragrance free and dye free products. No dryer sheets or fabric softener.   Use triamcinolone  0.1% ointment twice a day as needed for rash flares. Do not use on the face, neck, armpits or groin area. Do not use more than 3 weeks in a row.  Use protopic  twice a day as needed - does not recall picking up Rx in the past. Use over the counter antihistamines such as Zyrtec (cetirizine), Claritin (loratadine), Allegra (fexofenadine), or Xyzal (levocetirizine) daily as needed for itching.  Continue dupixent  injections every 2 weeks - if no improvement at the next visit will consider switching to a different biologics injectable or oral medications such as rinvoq - handout given.    Chronic rhinitis Past history - 2024 labs negative to the environmental allergy panel. Interim history -  asymptomatic. Assessment and Plan              No follow-ups on file.  No orders of the defined types were placed in this encounter.  Lab Orders  No laboratory test(s) ordered today    Diagnostics: Spirometry:  Tracings reviewed. Her effort: {Blank single:19197::"Good reproducible efforts.","It was hard to get consistent efforts and there is a question as to whether this reflects a maximal maneuver.","Poor effort, data can not be interpreted."} FVC: ***L FEV1: ***L, ***% predicted FEV1/FVC ratio: ***% Interpretation: {Blank single:19197::"Spirometry consistent with mild obstructive disease","Spirometry consistent with moderate obstructive disease","Spirometry consistent with severe obstructive disease","Spirometry consistent with possible restrictive disease","Spirometry consistent with mixed obstructive and restrictive disease","Spirometry uninterpretable due to technique","Spirometry consistent with normal pattern","No overt abnormalities noted given today's efforts"}.  Please see scanned spirometry results for details.  Skin Testing: {Blank single:19197::"Select foods","Environmental allergy panel","Environmental allergy panel and select foods","Food allergy panel","None","Deferred due to recent antihistamines use"}. *** Results discussed with patient/family.   Medication List:  Current Outpatient Medications  Medication Sig Dispense Refill  . hydrocortisone  2.5 % ointment Apply topically 2 (two) times daily. 30 g 5  . tacrolimus  (PROTOPIC ) 0.1 % ointment Apply topically 2 (two) times daily as needed (eczema spots). 100 g 2  . triamcinolone  ointment (KENALOG ) 0.1 % Apply 1 Application topically 2 (two) times daily as needed (rash flare). Do not use on the face, neck, armpits or groin area. Do not use more than 3 weeks in a row. 80 g 2   Current  Facility-Administered Medications  Medication Dose Route Frequency Provider Last Rate Last Admin  . dupilumab  (DUPIXENT ) prefilled  syringe 300 mg  300 mg Subcutaneous Q14 Days Brian Campanile, MD   300 mg at 11/26/23 1432   Allergies: No Known Allergies I reviewed her past medical history, social history, family history, and environmental history and no significant changes have been reported from her previous visit.  Review of Systems  Constitutional:  Negative for appetite change, chills, fever and unexpected weight change.  HENT:  Negative for congestion and rhinorrhea.   Eyes:  Negative for itching.  Respiratory:  Negative for cough, chest tightness, shortness of breath and wheezing.   Cardiovascular:  Negative for chest pain.  Gastrointestinal:  Negative for abdominal pain.  Genitourinary:  Negative for difficulty urinating.  Skin:  Positive for rash.  Allergic/Immunologic: Negative for environmental allergies.  Neurological:  Negative for headaches.   Objective: There were no vitals taken for this visit. There is no height or weight on file to calculate BMI. Physical Exam Vitals and nursing note reviewed.  Constitutional:      Appearance: Normal appearance. She is well-developed.  HENT:     Head: Normocephalic and atraumatic.     Right Ear: Tympanic membrane and external ear normal.     Left Ear: Tympanic membrane and external ear normal.     Nose: Nose normal.     Mouth/Throat:     Mouth: Mucous membranes are moist.     Pharynx: Oropharynx is clear.  Eyes:     Conjunctiva/sclera: Conjunctivae normal.  Cardiovascular:     Rate and Rhythm: Normal rate and regular rhythm.     Heart sounds: Normal heart sounds. No murmur heard.    No friction rub. No gallop.  Pulmonary:     Effort: Pulmonary effort is normal.     Breath sounds: Normal breath sounds. No wheezing, rhonchi or rales.  Musculoskeletal:     Cervical back: Neck supple.  Skin:    General: Skin is warm.     Findings: No rash.     Comments: Scattered hyperpigmented circular rashes throughout the torso, upper extremities and less  so on lower extremities b/l.  Neurological:     Mental Status: She is alert and oriented to person, place, and time.  Psychiatric:        Behavior: Behavior normal.  Previous notes and tests were reviewed. The plan was reviewed with the patient/family, and all questions/concerned were addressed.  It was my pleasure to see Cassie Garrett today and participate in her care. Please feel free to contact me with any questions or concerns.  Sincerely,  Eudelia Hero, DO Allergy & Immunology  [facility]  [place] office: [phone] [facility] office: [phone]

## 2024-01-28 ENCOUNTER — Ambulatory Visit: Admitting: Allergy

## 2024-02-10 ENCOUNTER — Other Ambulatory Visit: Payer: Self-pay

## 2024-03-11 ENCOUNTER — Other Ambulatory Visit: Payer: Self-pay

## 2024-03-15 ENCOUNTER — Other Ambulatory Visit: Payer: Self-pay

## 2024-03-18 ENCOUNTER — Encounter: Payer: Self-pay | Admitting: Family Medicine

## 2024-03-18 ENCOUNTER — Other Ambulatory Visit (HOSPITAL_COMMUNITY)
Admission: RE | Admit: 2024-03-18 | Discharge: 2024-03-18 | Disposition: A | Discharge status: Home / Self Care | Source: Ambulatory Visit | Attending: Family Medicine | Admitting: Family Medicine

## 2024-03-18 ENCOUNTER — Ambulatory Visit (INDEPENDENT_AMBULATORY_CARE_PROVIDER_SITE_OTHER): Admitting: Family Medicine

## 2024-03-18 ENCOUNTER — Ambulatory Visit

## 2024-03-18 VITALS — BP 120/68 | HR 88 | Temp 98.6°F | Ht 64.0 in | Wt 192.6 lb

## 2024-03-18 DIAGNOSIS — Z1159 Encounter for screening for other viral diseases: Secondary | ICD-10-CM | POA: Diagnosis not present

## 2024-03-18 DIAGNOSIS — L308 Other specified dermatitis: Secondary | ICD-10-CM

## 2024-03-18 DIAGNOSIS — Z0000 Encounter for general adult medical examination without abnormal findings: Secondary | ICD-10-CM | POA: Diagnosis not present

## 2024-03-18 DIAGNOSIS — D500 Iron deficiency anemia secondary to blood loss (chronic): Secondary | ICD-10-CM

## 2024-03-18 DIAGNOSIS — N760 Acute vaginitis: Secondary | ICD-10-CM

## 2024-03-18 MED ORDER — TRIAMCINOLONE ACETONIDE 0.1 % EX CREA: 1.0000 | TOPICAL_CREAM | Freq: Two times a day (BID) | CUTANEOUS | 1 refills | Status: DC

## 2024-03-18 MED ORDER — FLUCONAZOLE 150 MG PO TABS: 150.0000 mg | ORAL_TABLET | Freq: Once | ORAL | 0 refills | Status: AC

## 2024-03-18 NOTE — Progress Notes (Signed)
 Established Patient Office Visit   Subjective  Patient ID: Cassie Garrett, female    DOB: 09-17-1995  Age: 28 y.o. MRN: [id number]  Chief Complaint  Patient presents with   Annual Exam    Vaginal discharge and itching for 2 days     Patient is a 28 year old female seen for CPE and acute concern.  Patient endorses vaginal discharge x 2 days with pruritus.  Discharge noted as thick, white.  Also with mild dysuria.  Symptoms started after using a scented toilet paper while on a trip out of town.  Denies constipation, low back pain, nausea, vomiting, suprapubic pressure, urinary frequency.  Patient requesting refill on triamcinolone  cream.  States was refilled by allergist however cream works better than ointment.  Will get a flare on top of feet after getting a pedicure, if wears new close without washing them first, or if clothing rubs against skin.    Patient Active Problem List   Diagnosis Date Noted   Allergic rhinitis 08/14/2023   Cyst of ovary 07/17/2023   Pain in pelvis 07/17/2023   Urinary tract infectious disease 07/17/2023   Iron  deficiency anemia 06/25/2023   Intrinsic atopic dermatitis 10/14/2022   Anemia 05/14/2019   Past Medical History:  Diagnosis Date   Eczema    Iron  deficiency    History reviewed. No pertinent surgical history. Social History   Tobacco Use   Smoking status: Never    Passive exposure: Current   Smokeless tobacco: Never  Vaping Use   Vaping status: Never Used  Substance Use Topics   Alcohol use: No    Alcohol/week: 0.0 standard drinks of alcohol   Drug use: No   History reviewed. No pertinent family history. No Known Allergies  ROS Negative unless stated above    Objective:     BP 120/68 (BP Location: Left Arm, Patient Position: Sitting, Cuff Size: Normal)   Pulse 88   Temp 98.6 F (37 C) (Oral)   Ht 5' 4 (1.626 m)   Wt 192 lb 9.6 oz (87.4 kg)   LMP 02/27/2024 (Exact Date)   SpO2 95%   BMI 33.06 kg/m  BP Readings  from Last 3 Encounters:  03/18/24 120/68  01/13/24 126/75  11/28/23 108/74   Wt Readings from Last 3 Encounters:  03/18/24 192 lb 9.6 oz (87.4 kg)  01/13/24 188 lb 3.2 oz (85.4 kg)  08/14/23 191 lb 8 oz (86.9 kg)      Physical Exam Constitutional:      Appearance: Normal appearance.  HENT:     Head: Normocephalic and atraumatic.     Right Ear: Tympanic membrane, ear canal and external ear normal.     Left Ear: Tympanic membrane, ear canal and external ear normal.     Nose: Nose normal.     Mouth/Throat:     Mouth: Mucous membranes are moist.     Pharynx: No oropharyngeal exudate or posterior oropharyngeal erythema.  Eyes:     General: No scleral icterus.    Extraocular Movements: Extraocular movements intact.     Conjunctiva/sclera: Conjunctivae normal.     Pupils: Pupils are equal, round, and reactive to light.  Neck:     Thyroid: No thyromegaly.  Cardiovascular:     Rate and Rhythm: Normal rate and regular rhythm.     Pulses: Normal pulses.     Heart sounds: Normal heart sounds. No murmur heard.    No friction rub.  Pulmonary:     Effort: Pulmonary effort is  normal.     Breath sounds: Normal breath sounds. No wheezing, rhonchi or rales.  Abdominal:     General: Bowel sounds are normal.     Palpations: Abdomen is soft.     Tenderness: There is no abdominal tenderness.  Genitourinary:    Comments: Aptima self swab collected. Musculoskeletal:        General: No deformity. Normal range of motion.  Lymphadenopathy:     Cervical: No cervical adenopathy.  Skin:    General: Skin is warm and dry.     Findings: No lesion.  Neurological:     General: No focal deficit present.     Mental Status: She is alert and oriented to person, place, and time.  Psychiatric:        Mood and Affect: Mood normal.        Thought Content: Thought content normal.        03/18/2024    3:56 PM 08/06/2023   10:54 AM 07/25/2023    2:51 PM  Depression screen PHQ 2/9  Decreased  Interest 0 0 0  Down, Depressed, Hopeless 0 0 0  PHQ - 2 Score 0 0 0  Altered sleeping 0 0 0  Tired, decreased energy 0 0 0  Change in appetite 0 0 0  Feeling bad or failure about yourself  0 0 0  Trouble concentrating 0 0 0  Moving slowly or fidgety/restless 0 0 0  Suicidal thoughts 0 0 0  PHQ-9 Score 0 0 0  Difficult doing work/chores Not difficult at all Not difficult at all Not difficult at all      03/18/2024    3:57 PM 08/06/2023   10:54 AM 07/25/2023    2:51 PM 05/22/2023    3:40 PM  GAD 7 : Generalized Anxiety Score  Nervous, Anxious, on Edge 0 0 0 0  Control/stop worrying 0 0 0 0  Worry too much - different things 0 0 0 0  Trouble relaxing 0 0 0 0  Restless 0 0 0 0  Easily annoyed or irritable 0 0 0 0  Afraid - awful might happen 0 0 0 0  Total GAD 7 Score 0 0 0 0  Anxiety Difficulty Not difficult at all Not difficult at all Not difficult at all Not difficult at all     No results found for any visits on 03/18/24.    Assessment & Plan:   Well adult exam -     Comprehensive metabolic panel with GFR; Future -     Hemoglobin A1c; Future -     Lipid panel; Future -     T4, free; Future -     TSH; Future  Need for hepatitis C screening test -     Hepatitis C antibody  Iron  deficiency anemia due to chronic blood loss  Other eczema -     Triamcinolone  Acetonide; Apply 1 Application topically 2 (two) times daily.  Dispense: 453.6 g; Refill: 1  Acute vaginitis -     Cervicovaginal ancillary only -     Fluconazole ; Take 1 tablet (150 mg total) by mouth once for 1 dose.  Dispense: 1 tablet; Refill: 0   Age-appropriate health screenings discussed.  Will obtain labs.  Recent labs not repeated.  Immunizations reviewed.  Aptima self swab collected.  Start Diflucan  while waiting on results.  Keep follow-up for upcoming iron  infusion.  Triamcinolone  0.1% cream refilled for eczema.  Discussed other skin care/prevention.  Return if symptoms worsen or fail  to improve.    Clotilda JONELLE Single, MD

## 2024-03-18 NOTE — Addendum Note (Signed)
 Addended by: MERCER KIRSCH R on: 03/18/2024 03:59 PM   Modules accepted: Level of Service

## 2024-03-19 LAB — LIPID PANEL
Cholesterol: 152 mg/dL (ref 0–200)
HDL: 57.8 mg/dL (ref >39.00)
LDL Cholesterol: 78 mg/dL (ref 0–99)
NonHDL: 94.24
Total CHOL/HDL Ratio: 3
Triglycerides: 82 mg/dL (ref 0.0–149.0)
VLDL: 16.4 mg/dL (ref 0.0–40.0)

## 2024-03-19 LAB — COMPREHENSIVE METABOLIC PANEL WITH GFR
ALT: 13 U/L (ref 0–35)
AST: 17 U/L (ref 0–37)
Albumin: 4.4 g/dL (ref 3.5–5.2)
Alkaline Phosphatase: 49 U/L (ref 39–117)
BUN: 12 mg/dL (ref 6–23)
CO2: 30 meq/L (ref 19–32)
Calcium: 9.7 mg/dL (ref 8.4–10.5)
Chloride: 101 meq/L (ref 96–112)
Creatinine, Ser: 0.68 mg/dL (ref 0.40–1.20)
GFR: 116.99 mL/min (ref >60.00)
Glucose, Bld: 73 mg/dL (ref 70–99)
Potassium: 3.9 meq/L (ref 3.5–5.1)
Sodium: 137 meq/L (ref 135–145)
Total Bilirubin: 0.3 mg/dL (ref 0.2–1.2)
Total Protein: 7.7 g/dL (ref 6.0–8.3)

## 2024-03-19 LAB — TSH: TSH: 1.14 u[IU]/mL (ref 0.35–5.50)

## 2024-03-19 LAB — HEPATITIS C ANTIBODY: Hepatitis C Ab: NONREACTIVE

## 2024-03-19 LAB — T4, FREE: Free T4: 0.79 ng/dL (ref 0.60–1.60)

## 2024-03-19 LAB — HEMOGLOBIN A1C: Hgb A1c MFr Bld: 5.7 % (ref 4.6–6.5)

## 2024-03-22 ENCOUNTER — Other Ambulatory Visit (HOSPITAL_COMMUNITY): Payer: Self-pay

## 2024-03-22 ENCOUNTER — Ambulatory Visit: Payer: Self-pay | Admitting: Family Medicine

## 2024-03-22 LAB — CERVICOVAGINAL ANCILLARY ONLY
Bacterial Vaginitis (gardnerella): NEGATIVE
Candida Glabrata: NEGATIVE
Candida Vaginitis: POSITIVE — AB
Comment: NEGATIVE
Comment: NEGATIVE
Comment: NEGATIVE

## 2024-03-25 ENCOUNTER — Other Ambulatory Visit: Payer: Self-pay | Admitting: Family Medicine

## 2024-03-25 DIAGNOSIS — B3731 Acute candidiasis of vulva and vagina: Secondary | ICD-10-CM

## 2024-03-25 MED ORDER — FLUCONAZOLE 150 MG PO TABS
150.0000 mg | ORAL_TABLET | Freq: Once | ORAL | 0 refills | Status: AC
Start: 2024-03-25 — End: 2024-03-25

## 2024-03-28 NOTE — Patient Instructions (Incomplete)
 Atopic dermatitis - Do a daily soaking tub bath in warm water for 10-15 minutes.  - Use a gentle, unscented cleanser at the end of the bath (such as Dove unscented bar or baby wash, or Aveeno sensitive body wash). Then rinse, pat half-way dry, and apply a gentle, unscented moisturizer cream or ointment (Cerave, Cetaphil, Eucerin, Aveeno)  all over while still damp. Dry skin makes the itching and rash of eczema worse. The skin should be moisturized with a gentle, unscented moisturizer at least twice daily.  - Use only unscented liquid laundry detergent. - Apply prescribed topical steroid (triamcinolone  0.1% below neck or hydrocortisone  2.5% above neck) to flared areas (red and thickened eczema) after the moisturizer has soaked into the skin (wait at least 30 minutes). Taper off the topical steroids as the skin improves. Do not use topical steroid for more than 7-10 days at a time.  - Put Protopic  onto areas of rough eczema (that is not red) twice a day. May decrease to once a day as the eczema improves. This will not thin the skin, and is safe for chronic use. Do not put this onto normal appearing skin. - Continue Dupixent  for atopic dermatitis control  Chronic rhinitis Begin cetirizine 10 mg once a day as needed for a runny nose or itch Begin Flonase 2 sprays in each nostril once a day as needed for stuffy nose.  In the right nostril, point the applicator out toward the right ear. In the left nostril, point the applicator out toward the left ear Consider saline nasal rinses as needed for nasal symptoms. Use this before any medicated nasal sprays for best result A lab has been ordered to help us  evaluate your environmental allergies.  We will call you when the results become available.  Call the clinic if this treatment plan is not working well for you.  Follow up in 3 months or sooner if needed.

## 2024-03-28 NOTE — Progress Notes (Deleted)
   [address] [place] [address] Dept: [phone]  FOLLOW UP NOTE  Patient ID: Cassie Garrett, female    DOB: [birth year]  Age: 28 y.o. MRN: [id number] Date of Office Visit: 03/29/2024  Assessment  Chief Complaint: No chief complaint on file.  HPI Cassie Garrett is a 28 year old female who presents to the clinic for a follow up visit. She was last seen in this clinic on 11/28/2023 by Dr. Luke for evaluation of chronic rhinitis and atopic dermatitis.   Discussed the use of AI scribe software for clinical note transcription with the patient, who gave verbal consent to proceed.  History of Present Illness      Drug Allergies:  No Known Allergies  Physical Exam: LMP 02/27/2024 (Exact Date)    Physical Exam  Diagnostics:    Assessment and Plan: No diagnosis found.  No orders of the defined types were placed in this encounter.   There are no Patient Instructions on file for this visit.  No follow-ups on file.    Thank you for the opportunity to care for this patient.  Please do not hesitate to contact me with questions.  Arlean Mutter, FNP [facility]

## 2024-03-29 ENCOUNTER — Ambulatory Visit: Admitting: Family Medicine

## 2024-03-29 ENCOUNTER — Ambulatory Visit

## 2024-04-16 ENCOUNTER — Ambulatory Visit: Admitting: Family Medicine

## 2024-04-16 ENCOUNTER — Encounter: Payer: Self-pay | Admitting: Family Medicine

## 2024-04-16 ENCOUNTER — Telehealth: Payer: Self-pay | Admitting: Hematology and Oncology

## 2024-04-16 ENCOUNTER — Inpatient Hospital Stay: Attending: Physician Assistant

## 2024-04-16 ENCOUNTER — Inpatient Hospital Stay

## 2024-04-16 VITALS — BP 114/70 | HR 82 | Temp 98.0°F | Wt 194.2 lb

## 2024-04-16 DIAGNOSIS — N760 Acute vaginitis: Secondary | ICD-10-CM | POA: Diagnosis not present

## 2024-04-16 DIAGNOSIS — B9689 Other specified bacterial agents as the cause of diseases classified elsewhere: Secondary | ICD-10-CM

## 2024-04-16 DIAGNOSIS — D500 Iron deficiency anemia secondary to blood loss (chronic): Secondary | ICD-10-CM

## 2024-04-16 DIAGNOSIS — D509 Iron deficiency anemia, unspecified: Secondary | ICD-10-CM | POA: Diagnosis present

## 2024-04-16 LAB — CBC WITH DIFFERENTIAL (CANCER CENTER ONLY)
Abs Immature Granulocytes: 0.01 K/uL (ref 0.00–0.07)
Basophils Absolute: 0.1 K/uL (ref 0.0–0.1)
Basophils Relative: 1 %
Eosinophils Absolute: 0 K/uL (ref 0.0–0.5)
Eosinophils Relative: 1 %
HCT: 42.2 % (ref 36.0–46.0)
Hemoglobin: 13.7 g/dL (ref 12.0–15.0)
Immature Granulocytes: 0 %
Lymphocytes Relative: 44 %
Lymphs Abs: 2.7 K/uL (ref 0.7–4.0)
MCH: 24.3 pg — ABNORMAL LOW (ref 26.0–34.0)
MCHC: 32.5 g/dL (ref 30.0–36.0)
MCV: 74.8 fL — ABNORMAL LOW (ref 80.0–100.0)
Monocytes Absolute: 0.5 K/uL (ref 0.1–1.0)
Monocytes Relative: 8 %
Neutro Abs: 2.9 K/uL (ref 1.7–7.7)
Neutrophils Relative %: 46 %
Platelet Count: 315 K/uL (ref 150–400)
RBC: 5.64 MIL/uL — ABNORMAL HIGH (ref 3.87–5.11)
RDW: 16.6 % — ABNORMAL HIGH (ref 11.5–15.5)
WBC Count: 6.2 K/uL (ref 4.0–10.5)
nRBC: 0 % (ref 0.0–0.2)

## 2024-04-16 LAB — IRON AND IRON BINDING CAPACITY (CC-WL,HP ONLY)
Iron: 42 ug/dL (ref 28–170)
Saturation Ratios: 8 % — ABNORMAL LOW (ref 10.4–31.8)
TIBC: 533 ug/dL — ABNORMAL HIGH (ref 250–450)
UIBC: 491 ug/dL — ABNORMAL HIGH (ref 148–442)

## 2024-04-16 LAB — FERRITIN: Ferritin: 11 ng/mL (ref 11–307)

## 2024-04-16 MED ORDER — METRONIDAZOLE 500 MG PO TABS: 500.0000 mg | ORAL_TABLET | Freq: Two times a day (BID) | ORAL | 0 refills | Status: AC

## 2024-04-16 NOTE — Progress Notes (Signed)
   Established Patient Office Visit  Subjective   Patient ID: Cassie Garrett, female    DOB: 02-01-1996  Age: 28 y.o. MRN: [id number]  Chief Complaint  Patient presents with   Vaginal Discharge    HPI   Cassie Garrett is seen as a work in today with some vaginal discharge for past few days.  She has noticed some odor which is somewhat fishy in quality.  She has had very similar discharge in the past diagnosed as bacterial vaginosis.  She just had physical and had a swab which showed Candida for which she was treated.  She denies any pruritus.  She has 1 partner and no suspicion for any STD risk.  Denies any recent fevers or chills.  No pelvic pain.  Past Medical History:  Diagnosis Date   Eczema    Iron  deficiency    History reviewed. No pertinent surgical history.  reports that she has never smoked. She has been exposed to tobacco smoke. She has never used smokeless tobacco. She reports that she does not drink alcohol and does not use drugs. family history is not on file. No Known Allergies  Review of Systems  Constitutional:  Negative for chills and fever.  Genitourinary:  Negative for dysuria and hematuria.      Objective:     BP 114/70   Pulse 82   Temp 98 F (36.7 C) (Oral)   Wt 194 lb 3.2 oz (88.1 kg)   LMP 02/27/2024 (Exact Date)   SpO2 96%   BMI 33.33 kg/m    Physical Exam Vitals reviewed.  Constitutional:      General: She is not in acute distress.    Appearance: She is not ill-appearing.  Cardiovascular:     Rate and Rhythm: Normal rate and regular rhythm.  Pulmonary:     Effort: Pulmonary effort is normal.     Breath sounds: Normal breath sounds.  Neurological:     Mental Status: She is alert.      No results found for any visits on 04/16/24.    The ASCVD Risk score (Arnett DK, et al., 2019) failed to calculate for the following reasons:   The 2019 ASCVD risk score is only valid for ages 89 to 2    Assessment & Plan:   Problem List Items  Addressed This Visit   None Visit Diagnoses       BV (bacterial vaginosis)    -  Primary   Relevant Medications   metroNIDAZOLE  (FLAGYL ) 500 MG tablet     Patient relates 3-day history of odorous vaginal discharge.  Has had similar symptoms with bacterial vaginosis in the past.  She has 1 partner and had recent vaginal swab which was negative for BV but symptoms have developed since then.  We did discuss possible STD testing but she feels like this is low risk.  She has had the same partner for quite some time.  We agreed to empirically cover her and treat with metronidazole  500 g twice daily for 7 days.  She is aware not to mix alcohol with this.  Follow-up with primary if she has any persistent or recurrent symptoms  No follow-ups on file.    Wolm Scarlet, MD

## 2024-04-19 ENCOUNTER — Other Ambulatory Visit: Payer: Self-pay | Admitting: Physician Assistant

## 2024-04-19 ENCOUNTER — Ambulatory Visit: Payer: Self-pay | Admitting: Physician Assistant

## 2024-04-19 ENCOUNTER — Other Ambulatory Visit: Payer: Self-pay

## 2024-04-19 NOTE — Progress Notes (Signed)
 Pt has not filled since 01/16/24, and has not received doses shipped then. Dis-enrolling.

## 2024-04-22 ENCOUNTER — Telehealth: Payer: Self-pay

## 2024-04-22 NOTE — Telephone Encounter (Signed)
 Johnston, patient will be scheduled as soon as possible.  Auth Submission: NO AUTH NEEDED Site of care: Site of care: [facility] Payer: [employer] med[employer] Medication & CPT/J Code(s) submitted: Monoferric  (Ferrci derisomaltose) [phone] Diagnosis Code:  Route of submission (phone, fax, portal): phone Phone # [phone] Fax # Auth type: Buy/Bill PB Units/visits requested: 1000mg  x 1 dose Reference number: confirmed on the automated system Approval from: 04/22/24 to 08/22/24

## 2024-04-29 ENCOUNTER — Ambulatory Visit (INDEPENDENT_AMBULATORY_CARE_PROVIDER_SITE_OTHER)

## 2024-04-29 ENCOUNTER — Ambulatory Visit: Admitting: Family Medicine

## 2024-04-29 VITALS — BP 121/80 | HR 73 | Temp 98.2°F | Resp 18 | Ht 64.0 in | Wt 193.6 lb

## 2024-04-29 DIAGNOSIS — D500 Iron deficiency anemia secondary to blood loss (chronic): Secondary | ICD-10-CM | POA: Diagnosis not present

## 2024-04-29 DIAGNOSIS — N920 Excessive and frequent menstruation with regular cycle: Secondary | ICD-10-CM

## 2024-04-29 MED ORDER — SODIUM CHLORIDE 0.9 % IV SOLN
1000.0000 mg | Freq: Once | INTRAVENOUS | Status: AC
  Administered 2024-04-29: 1000 mg via INTRAVENOUS
  Filled 2024-04-29: qty 10

## 2024-04-29 NOTE — Progress Notes (Signed)
 Diagnosis: Iron  Deficiency Anemia  Provider:  Praveen Mannam MD  Procedure: IV Infusion  IV Type: Peripheral, IV Location: L Antecubital  Monoferric  (Ferric Derisomaltose ), Dose: 1000 mg  Infusion Start Time: 1549  Infusion Stop Time: 1610  Post Infusion IV Care: Patient declined observation and Peripheral IV Discontinued  Discharge: Condition: Good, Destination: Home . AVS Declined  Performed by:  Maximiano JONELLE Pouch, LPN

## 2024-04-30 ENCOUNTER — Ambulatory Visit: Admitting: Family Medicine

## 2024-05-06 ENCOUNTER — Ambulatory Visit: Admitting: Family Medicine

## 2024-05-06 DIAGNOSIS — N926 Irregular menstruation, unspecified: Secondary | ICD-10-CM

## 2024-05-10 ENCOUNTER — Other Ambulatory Visit: Payer: Self-pay | Admitting: Family Medicine

## 2024-05-10 DIAGNOSIS — L308 Other specified dermatitis: Secondary | ICD-10-CM

## 2024-05-12 ENCOUNTER — Ambulatory Visit: Admitting: Family Medicine

## 2024-05-17 ENCOUNTER — Ambulatory Visit: Admitting: Family Medicine

## 2024-06-01 ENCOUNTER — Ambulatory Visit: Payer: Self-pay

## 2024-06-01 NOTE — Telephone Encounter (Signed)
 Patient unable to come in for an appointment today but tomorrow was fine. Appointment made for 06/02/2024 at 2:30 PM with Dr Garnette Olmsted   FYI Only or Action Required?: FYI only for provider.  Patient was last seen in primary care on 04/16/2024 by Micheal Wolm ORN, MD.  Called Nurse Triage reporting Urinary Frequency.  Symptoms began 3-4 days ago.  Interventions attempted: Rest, hydration, or home remedies.  Symptoms are: gradually worsening.  Triage Disposition: See Physician Within 24 Hours  Patient/caregiver understands and will follow disposition?: Yes                  Copied from CRM #[id number]. Topic: Clinical - Red Word Triage >> Jun 01, 2024 12:20 PM [name] wrote: [facility] that prompted transfer to Nurse Triage: patient stated she believes  she has a uti, having pain when urinating     ----------------------------------------------------------------------- From previous Reason for Contact - Scheduling: Patient/patient representative is calling to schedule an appointment. Refer to attachments for appointment information. Reason for Disposition  Urinating more frequently than usual (i.e., frequency) OR new-onset of the feeling of an urgent need to urinate (i.e., urgency)  Protocols used: Urinary Symptoms-A-AH

## 2024-06-01 NOTE — Telephone Encounter (Signed)
 Patient has appt 9/24

## 2024-06-02 ENCOUNTER — Ambulatory Visit: Admitting: Family Medicine

## 2024-06-03 ENCOUNTER — Encounter: Payer: Self-pay | Admitting: Family Medicine

## 2024-06-04 ENCOUNTER — Ambulatory Visit: Admitting: Family Medicine

## 2024-06-08 ENCOUNTER — Ambulatory Visit: Admitting: Internal Medicine

## 2024-06-29 ENCOUNTER — Ambulatory Visit
Admission: EM | Admit: 2024-06-29 | Discharge: 2024-06-29 | Disposition: A | Discharge status: Home / Self Care | Attending: Student | Admitting: Student

## 2024-06-29 DIAGNOSIS — R399 Unspecified symptoms and signs involving the genitourinary system: Secondary | ICD-10-CM | POA: Insufficient documentation

## 2024-06-29 DIAGNOSIS — N3000 Acute cystitis without hematuria: Secondary | ICD-10-CM | POA: Diagnosis present

## 2024-06-29 LAB — POCT URINE DIPSTICK
Bilirubin, UA: NEGATIVE
Glucose, UA: NEGATIVE mg/dL
Ketones, POC UA: NEGATIVE mg/dL
Nitrite, UA: POSITIVE — AB
POC PROTEIN,UA: 30 — AB
Spec Grav, UA: 1.02 (ref 1.010–1.025)
Urobilinogen, UA: 0.2 U/dL
pH, UA: 7.5 (ref 5.0–8.0)

## 2024-06-29 LAB — POCT URINE PREGNANCY: Preg Test, Ur: NEGATIVE

## 2024-06-29 MED ORDER — NITROFURANTOIN MONOHYD MACRO 100 MG PO CAPS: 100.0000 mg | ORAL_CAPSULE | Freq: Two times a day (BID) | ORAL | 0 refills | Status: AC

## 2024-06-29 NOTE — ED Triage Notes (Signed)
 Patient reports having a possible UTI. Currently having lots of urinary frequency, urgency and small amounts of urine when going. Some dysuria noted. Symptoms began 2 wks ago.

## 2024-06-29 NOTE — ED Provider Notes (Signed)
 [facility]    CSN: [id number] Arrival date & time: 06/29/24  1515      History   Chief Complaint Chief Complaint  Patient presents with   UTI Symptoms    HPI Cassie Garrett is a 28 y.o. female presenting with 2 weeks of urinary symptoms. H/o ovarian cyst, pelvic pain, UTI, recurrent UTI. Describes 2 weeks of frequency, urgency, small voids, dysuria, mild crampy lower abdominal pain.  Symptoms consistent with her typical UTI symptoms.  Denies hematuria, new back pain, fevers. Has taken Azo 1 day ago.  Unsure when her last UTI was. Was diagnosed with BV 1 month ago and completed metrogel  at that time.   HPI  Past Medical History:  Diagnosis Date   Eczema    Iron  deficiency     Patient Active Problem List   Diagnosis Date Noted   Allergic rhinitis 08/14/2023   Cyst of ovary 07/17/2023   Pain in pelvis 07/17/2023   Urinary tract infectious disease 07/17/2023   Iron  deficiency anemia 06/25/2023   Intrinsic atopic dermatitis 10/14/2022   Recurrent urinary tract infection 03/15/2021   Anemia 05/14/2019    History reviewed. No pertinent surgical history.  OB History     Gravida  1   Para      Term      Preterm      AB      Living         SAB      IAB      Ectopic      Multiple      Live Births               Home Medications    Prior to Admission medications   Medication Sig Start Date End Date Taking? Authorizing Provider  fluconazole  (DIFLUCAN ) 150 MG tablet Take 150 mg by mouth once. 03/25/24  Yes [provider]  metroNIDAZOLE  (METROGEL ) 0.75 % vaginal gel Place 1 Applicatorful vaginally at bedtime. 01/26/24  Yes [provider]  nitrofurantoin , macrocrystal-monohydrate, (MACROBID ) 100 MG capsule Take 1 capsule (100 mg total) by mouth 2 (two) times daily for 7 days. 06/29/24 07/06/24 Yes Lydia Toren E, PA-C  phenazopyridine (PYRIDIUM) 95 MG tablet Take 95 mg by mouth 3 (three) times daily as needed for  pain.   Yes [provider]  hydrocortisone  2.5 % ointment Apply topically 2 (two) times daily. 08/14/23   Cari Arlean HERO, FNP  tacrolimus  (PROTOPIC ) 0.1 % ointment Apply topically 2 (two) times daily as needed (eczema spots). 11/28/23   Luke Orlan HERO, DO  triamcinolone  cream (KENALOG ) 0.1 % APPLY 1 APPLICATION EXTERNALLY TWICE DAILY 05/11/24   Mercer Clotilda SAUNDERS, MD    Family History History reviewed. No pertinent family history.  Social History Social History   Tobacco Use   Smoking status: Some Days    Types: Cigarettes    Passive exposure: Current   Smokeless tobacco: Never   Tobacco comments:    Hooka  Vaping Use   Vaping status: Never Used  Substance Use Topics   Alcohol use: No    Alcohol/week: 0.0 standard drinks of alcohol   Drug use: No     Allergies   Patient has no known allergies.   Review of Systems Review of Systems  Constitutional:  Negative for appetite change, chills, diaphoresis and fever.  Respiratory:  Negative for shortness of breath.   Cardiovascular:  Negative for chest pain.  Gastrointestinal:  Negative for abdominal pain, blood in stool,  constipation, diarrhea, nausea and vomiting.  Genitourinary:  Positive for dysuria and frequency. Negative for decreased urine volume, difficulty urinating, flank pain, genital sores, hematuria and urgency.  Musculoskeletal:  Negative for back pain.  Neurological:  Negative for dizziness, weakness and light-headedness.  All other systems reviewed and are negative.    Physical Exam Triage Vital Signs ED Triage Vitals  Encounter Vitals Group     BP 06/29/24 1532 116/77     Girls Systolic BP Percentile --      Girls Diastolic BP Percentile --      Boys Systolic BP Percentile --      Boys Diastolic BP Percentile --      Pulse Rate 06/29/24 1532 93     Resp 06/29/24 1532 16     Temp 06/29/24 1532 98.9 F (37.2 C)     Temp Source 06/29/24 1532 Oral     SpO2 06/29/24 1532 96 %     Weight 06/29/24 1530 170  lb (77.1 kg)     Height 06/29/24 1530 5' 4 (1.626 m)     Head Circumference --      Peak Flow --      Pain Score 06/29/24 1529 2     Pain Loc --      Pain Education --      Exclude from Growth Chart --    No data found.  Updated Vital Signs BP 116/77 (BP Location: Left Arm)   Pulse 93   Temp 98.9 F (37.2 C) (Oral)   Resp 16   Ht 5' 4 (1.626 m)   Wt 170 lb (77.1 kg)   LMP 06/09/2024 (Exact Date)   SpO2 96%   BMI 29.18 kg/m   Visual Acuity Right Eye Distance:   Left Eye Distance:   Bilateral Distance:    Right Eye Near:   Left Eye Near:    Bilateral Near:     Physical Exam Vitals reviewed.  Constitutional:      General: She is not in acute distress.    Appearance: Normal appearance. She is not ill-appearing.  HENT:     Head: Normocephalic and atraumatic.     Mouth/Throat:     Mouth: Mucous membranes are moist.     Comments: Moist mucous membranes Eyes:     Extraocular Movements: Extraocular movements intact.     Pupils: Pupils are equal, round, and reactive to light.  Cardiovascular:     Rate and Rhythm: Normal rate and regular rhythm.     Heart sounds: Normal heart sounds.  Pulmonary:     Effort: Pulmonary effort is normal.     Breath sounds: Normal breath sounds. No wheezing, rhonchi or rales.  Abdominal:     General: Bowel sounds are normal. There is no distension.     Palpations: Abdomen is soft. There is no mass.     Tenderness: There is no abdominal tenderness. There is no right CVA tenderness, left CVA tenderness, guarding or rebound.     Comments: NO reproducible abd pain.  Skin:    General: Skin is warm.     Capillary Refill: Capillary refill takes less than 2 seconds.     Comments: Good skin turgor  Neurological:     General: No focal deficit present.     Mental Status: She is alert and oriented to person, place, and time.  Psychiatric:        Mood and Affect: Mood normal.        Behavior: Behavior normal.  UC Treatments / Results   Labs (all labs ordered are listed, but only abnormal results are displayed) Labs Reviewed  POCT URINE DIPSTICK - Abnormal; Notable for the following components:      Result Value   Color, UA other (*)    Clarity, UA cloudy (*)    Blood, UA trace-intact (*)    POC PROTEIN,UA =30 (*)    Nitrite, UA Positive (*)    Leukocytes, UA Small (1+) (*)    All other components within normal limits  POCT URINE PREGNANCY - Normal  URINE CULTURE  CERVICOVAGINAL ANCILLARY ONLY    EKG   Radiology No results found.  Procedures Procedures (including critical care time)  Medications Ordered in UC Medications - No data to display  Initial Impression / Assessment and Plan / UC Course  I have reviewed the triage vital signs and the nursing notes.  Pertinent labs & imaging results that were available during my care of the patient were reviewed by me and considered in my medical decision making (see chart for details).     Patient is a pleasant 28 yo female presenting with urinary symptoms.  Her symptoms are consistent with her typical UTI symptoms.  She has taken Azo in the last day, so UA is invalid.  Negative urine pregnancy.  Culture sent.  Her last BV was 1 month ago, so we are also checking a cervicovaginal swab for BV and yeast.  Unsure when her last UTI was.  Will manage UTI with Macrobid  today, pending culture results.  Good hydration.  Final Clinical Impressions(s) / UC Diagnoses   Final diagnoses:  Urinary symptom or sign  Acute cystitis without hematuria     Discharge Instructions      - Macrobid  twice daily for 7 days.  This antibiotic treats UTIs. - We are also testing for BV and yeast, and will call in about 3 days if we need to send treatment for this as well.     ED Prescriptions     Medication Sig Dispense Auth. Provider   nitrofurantoin , macrocrystal-monohydrate, (MACROBID ) 100 MG capsule Take 1 capsule (100 mg total) by mouth 2 (two) times daily for 7 days. 14  capsule Jacqualin Shirkey E, PA-C      PDMP not reviewed this encounter.   Arlyss Leita BRAVO, PA-C 06/29/24 1720

## 2024-06-29 NOTE — Discharge Instructions (Addendum)
-   Macrobid  twice daily for 7 days.  This antibiotic treats UTIs. - We are also testing for BV and yeast, and will call in about 3 days if we need to send treatment for this as well.

## 2024-06-30 ENCOUNTER — Ambulatory Visit (HOSPITAL_COMMUNITY): Payer: Self-pay

## 2024-06-30 LAB — CERVICOVAGINAL ANCILLARY ONLY
Bacterial Vaginitis (gardnerella): POSITIVE — AB
Candida Glabrata: NEGATIVE
Candida Vaginitis: NEGATIVE
Comment: NEGATIVE
Comment: NEGATIVE
Comment: NEGATIVE

## 2024-06-30 MED ORDER — METRONIDAZOLE 0.75 % VA GEL: 1.0000 | Freq: Every day | VAGINAL | 0 refills | Status: AC

## 2024-07-01 LAB — URINE CULTURE: Culture: >=100000 — AB

## 2024-07-15 ENCOUNTER — Other Ambulatory Visit: Payer: Self-pay | Admitting: Physician Assistant

## 2024-07-15 DIAGNOSIS — D500 Iron deficiency anemia secondary to blood loss (chronic): Secondary | ICD-10-CM

## 2024-07-15 NOTE — Progress Notes (Deleted)
 [facility] [phone]   [phone]  PROGRESS NOTE  Patient Care Team: Cassie Clotilda SAUNDERS, MD as PCP - General (Family Medicine)   CHIEF COMPLAINTS/PURPOSE OF CONSULTATION:  Iron  deficiency anemia 2/2 menorrhagia  HISTORY OF PRESENTING ILLNESS:  Cassie Garrett 28 y.o. female presents for a follow up visit for iron  deficiency anemia. She was last seen on 06/25/2023. In the interim, she received IV monoferric  1000 mg x 1 dose. She is unaccompanied for this visit.   On exam today, [name] reports ***  MEDICAL HISTORY:  Past Medical History:  Diagnosis Date   Eczema    Iron  deficiency     SURGICAL HISTORY: No past surgical history on file.  SOCIAL HISTORY: Social History   Socioeconomic History   Marital status: Single    Spouse name: Not on file   Number of children: Not on file   Years of education: Not on file   Highest education level: Not on file  Occupational History   Not on file  Tobacco Use   Smoking status: Some Days    Types: Cigarettes    Passive exposure: Current   Smokeless tobacco: Never   Tobacco comments:    Hooka  Vaping Use   Vaping status: Never Used  Substance and Sexual Activity   Alcohol use: No    Alcohol/week: 0.0 standard drinks of alcohol   Drug use: No   Sexual activity: Not Currently    Birth control/protection: Pill  Other Topics Concern   Not on file  Social History Narrative   Not on file   Social Drivers of Health   Financial Resource Strain: Not on file  Food Insecurity: Not on file  Transportation Needs: Not on file  Physical Activity: Not on file  Stress: Not on file  Social Connections: Unknown (01/07/2022)   [facility]   Social Network    Social Network: Not on file  Intimate Partner Violence: Unknown (12/10/2021)   Received from [facility]   HITS    Physically Hurt: Not on file    Insult or Talk Down To: Not on file    Threaten Physical Harm: Not on  file    Scream or Curse: Not on file    FAMILY HISTORY: No family history on file.  ALLERGIES:  has no known allergies.  MEDICATIONS:  Current Outpatient Medications  Medication Sig Dispense Refill   fluconazole  (DIFLUCAN ) 150 MG tablet Take 150 mg by mouth once.     hydrocortisone  2.5 % ointment Apply topically 2 (two) times daily. 30 g 5   metroNIDAZOLE  (METROGEL ) 0.75 % vaginal gel Place 1 Applicatorful vaginally at bedtime.     phenazopyridine (PYRIDIUM) 95 MG tablet Take 95 mg by mouth 3 (three) times daily as needed for pain.     tacrolimus  (PROTOPIC ) 0.1 % ointment Apply topically 2 (two) times daily as needed (eczema spots). 100 g 2   triamcinolone  cream (KENALOG ) 0.1 % APPLY 1 APPLICATION EXTERNALLY TWICE DAILY 454 g 0   Current Facility-Administered Medications  Medication Dose Route Frequency Provider Last Rate Last Admin   dupilumab  (DUPIXENT ) prefilled syringe 300 mg  300 mg Subcutaneous Q14 Days Jeneal Danita Macintosh, MD   300 mg at 11/26/23 1432    REVIEW OF SYSTEMS:   Constitutional: ( - ) fevers, ( - )  chills , ( - ) night sweats Eyes: ( - ) blurriness of vision, ( - ) double vision, ( - ) watery eyes  Ears, nose, mouth, throat, and face: ( - ) mucositis, ( - ) sore throat Respiratory: ( - ) cough, ( - ) dyspnea, ( - ) wheezes Cardiovascular: ( - ) palpitation, ( - ) chest discomfort, ( - ) lower extremity swelling Gastrointestinal:  ( - ) nausea, ( - ) heartburn, ( - ) change in bowel habits Skin: ( - ) abnormal skin rashes Lymphatics: ( - ) new lymphadenopathy, ( - ) easy bruising Neurological: ( - ) numbness, ( - ) tingling, ( - ) new weaknesses Behavioral/Psych: ( - ) mood change, ( - ) new changes  All other systems were reviewed with the patient and are negative.  PHYSICAL EXAMINATION: ECOG PERFORMANCE STATUS: 1 - Symptomatic but completely ambulatory  There were no vitals filed for this visit.  There were no vitals filed for this  visit.   GENERAL: well appearing female in NAD  SKIN: skin color, texture, turgor are normal, no rashes or significant lesions EYES: conjunctiva are pink and non-injected, sclera clear LUNGS: clear to auscultation and percussion with normal breathing effort HEART: regular rate & rhythm and no murmurs and no lower extremity edema Musculoskeletal: no cyanosis of digits and no clubbing  PSYCH: alert & oriented x 3, fluent speech NEURO: no focal motor/sensory deficits  LABORATORY DATA:  I have reviewed the data as listed    Latest Ref Rng & Units 04/16/2024    2:20 PM 01/13/2024   10:36 AM 07/25/2023    3:19 PM  CBC  WBC 4.0 - 10.5 K/uL 6.2  5.4  5.6   Hemoglobin 12.0 - 15.0 g/dL 86.2  85.6  87.3   Hematocrit 36.0 - 46.0 % 42.2  43.4  42.4   Platelets 150 - 400 K/uL 315  302  375        Latest Ref Rng & Units 03/18/2024    4:02 PM 07/25/2023    3:19 PM 04/14/2019   12:04 AM  CMP  Glucose 70 - 99 mg/dL 73  90  94   BUN 6 - 23 mg/dL 12  10  8    Creatinine 0.40 - 1.20 mg/dL 9.31  9.35  9.44   Sodium 135 - 145 mEq/L 137  140  139   Potassium 3.5 - 5.1 mEq/L 3.9  3.9  2.9   Chloride 96 - 112 mEq/L 101  103  103   CO2 19 - 32 mEq/L 30  27  27    Calcium 8.4 - 10.5 mg/dL 9.7  9.5  9.5   Total Protein 6.0 - 8.3 g/dL 7.7  7.2  8.3   Total Bilirubin 0.2 - 1.2 mg/dL 0.3  0.5  0.3   Alkaline Phos 39 - 117 U/L 49   49   AST 0 - 37 U/L 17  15  30    ALT 0 - 35 U/L 13  11  20      ASSESSMENT & PLAN Cassie Garrett is a 28 y.o. female who presents to the clinic for a follow up for iron  deficiency anemia.   # Iron  Deficiency Anemia 2/2 to GYN Bleeding --Findings are consistent with iron  deficiency anemia secondary to patient's menorrhagia --Under the care of OB/GYN with plans to start hormonal patch to better control her menstrual cycles.  --Does not take PO iron  due to questionable absorption.  --Last received monoferric  1000 mg x 1 dose on 04/29/2024 --Labs today *** --We will plan to  proceed with IV iron  therapy in order to help bolster the  patient's blood counts --RTC in 3 months with labs and 6 months with labs and follow up.   No orders of the defined types were placed in this encounter.   All questions were answered. The patient knows to call the clinic with any problems, questions or concerns.  I have spent a total of 30 minutes minutes of face-to-face and non-face-to-face time, preparing to see the patient, performing a medically appropriate examination, counseling and educating the patient, ordering medications/tests/procedures, documenting clinical information in the electronic health record, independently interpreting results and communicating results to the patient, and care coordination.   Johnston Police, PA-C Department of Hematology/Oncology [facility] Phone: [phone]

## 2024-07-16 ENCOUNTER — Inpatient Hospital Stay: Admitting: Physician Assistant

## 2024-07-16 ENCOUNTER — Inpatient Hospital Stay: Attending: Physician Assistant

## 2024-07-23 ENCOUNTER — Telehealth: Payer: Self-pay | Admitting: Allergy

## 2024-07-23 NOTE — Telephone Encounter (Signed)
 Left voicemail to give the office a call back to schedule Dupixent reapproval appointment.

## 2024-08-13 ENCOUNTER — Ambulatory Visit
Admission: EM | Admit: 2024-08-13 | Discharge: 2024-08-13 | Disposition: A | Discharge status: Home / Self Care | Attending: Physician Assistant | Admitting: Physician Assistant

## 2024-08-13 VITALS — BP 106/72 | HR 88 | Temp 97.9°F | Resp 16 | Ht 64.0 in | Wt 140.0 lb

## 2024-08-13 DIAGNOSIS — N3001 Acute cystitis with hematuria: Secondary | ICD-10-CM

## 2024-08-13 LAB — POCT URINE DIPSTICK
Bilirubin, UA: NEGATIVE
Glucose, UA: 100 mg/dL — AB
Ketones, POC UA: NEGATIVE mg/dL
Nitrite, UA: POSITIVE — AB
POC PROTEIN,UA: >=300 — AB
Spec Grav, UA: >=1.03 — AB (ref 1.010–1.025)
Urobilinogen, UA: 1 U/dL
pH, UA: 6 (ref 5.0–8.0)

## 2024-08-13 LAB — GLUCOSE, POCT (MANUAL RESULT ENTRY): POCT Glucose (KUC): 94 mg/dL (ref 70–99)

## 2024-08-13 MED ORDER — NITROFURANTOIN MONOHYD MACRO 100 MG PO CAPS: 100.0000 mg | ORAL_CAPSULE | Freq: Two times a day (BID) | ORAL | 0 refills | Status: DC

## 2024-08-13 MED ORDER — NITROFURANTOIN MONOHYD MACRO 100 MG PO CAPS: 100.0000 mg | ORAL_CAPSULE | Freq: Two times a day (BID) | ORAL | 0 refills | Status: AC

## 2024-08-13 NOTE — ED Provider Notes (Signed)
 [facility]    CSN: [id number] Arrival date & time: 08/13/24  1855      History   Chief Complaint Chief Complaint  Patient presents with   UTI Symptoms    HPI Eliannah Hinde is a 28 y.o. female.   Patient presents today with a several hour history of UTI symptoms.  She reports frequency, urgency, some hematuria.  Denies any abdominal pain, pelvic pain, fever, nausea, vomiting, vaginal symptoms.  She has had UTIs with similar symptoms in the past.  She was last seen and treated approximately 6 to 8 weeks ago with Macrobid  and had resolution of symptoms.  At that visit her culture result grew pansensitive E. coli.  She does not take an SGLT2 inhibitor.  She denies additional antibiotics in the past 90 days outside of the Macrobid  that was prescribed at her last visit.  She has not seen a urologist.  She denies any recent urogenital procedure, self-catheterization, single kidney.  She has been using Azo to help manage her symptoms but this has been ineffective in mitigating pain.  She has no concern for pregnancy.    Past Medical History:  Diagnosis Date   Eczema    Iron  deficiency     Patient Active Problem List   Diagnosis Date Noted   Allergic rhinitis 08/14/2023   Cyst of ovary 07/17/2023   Pain in pelvis 07/17/2023   Urinary tract infectious disease 07/17/2023   Iron  deficiency anemia 06/25/2023   Intrinsic atopic dermatitis 10/14/2022   Recurrent urinary tract infection 03/15/2021   Anemia 05/14/2019    History reviewed. No pertinent surgical history.  OB History     Gravida  1   Para      Term      Preterm      AB      Living         SAB      IAB      Ectopic      Multiple      Live Births               Home Medications    Prior to Admission medications   Medication Sig Start Date End Date Taking? Authorizing Provider  fluconazole  (DIFLUCAN ) 150 MG tablet Take 150 mg by mouth once. 03/25/24   [provider]   hydrocortisone  2.5 % ointment Apply topically 2 (two) times daily. 08/14/23   Cari Arlean HERO, FNP  metroNIDAZOLE  (METROGEL ) 0.75 % vaginal gel Place 1 Applicatorful vaginally at bedtime. 01/26/24   [provider]  nitrofurantoin , macrocrystal-monohydrate, (MACROBID ) 100 MG capsule Take 1 capsule (100 mg total) by mouth 2 (two) times daily. 08/13/24   Fritz Cauthon K, PA-C  phenazopyridine (PYRIDIUM) 95 MG tablet Take 95 mg by mouth 3 (three) times daily as needed for pain.    [provider]  tacrolimus  (PROTOPIC ) 0.1 % ointment Apply topically 2 (two) times daily as needed (eczema spots). 11/28/23   Luke Orlan HERO, DO  triamcinolone  cream (KENALOG ) 0.1 % APPLY 1 APPLICATION EXTERNALLY TWICE DAILY 05/11/24   Mercer Clotilda SAUNDERS, MD    Family History History reviewed. No pertinent family history.  Social History Social History   Tobacco Use   Smoking status: Some Days    Types: Cigarettes    Passive exposure: Past   Smokeless tobacco: Never   Tobacco comments:    Hooka  Vaping Use   Vaping status: Never Used  Substance Use Topics   Alcohol use:  No    Alcohol/week: 0.0 standard drinks of alcohol   Drug use: No     Allergies   Patient has no known allergies.   Review of Systems Review of Systems  Constitutional:  Positive for activity change. Negative for appetite change, fatigue and fever.  Gastrointestinal:  Negative for abdominal pain, diarrhea, nausea and vomiting.  Genitourinary:  Positive for frequency, hematuria and urgency. Negative for dysuria, vaginal bleeding, vaginal discharge and vaginal pain.     Physical Exam Triage Vital Signs ED Triage Vitals  Encounter Vitals Group     BP 08/13/24 1905 106/72     Girls Systolic BP Percentile --      Girls Diastolic BP Percentile --      Boys Systolic BP Percentile --      Boys Diastolic BP Percentile --      Pulse Rate 08/13/24 1905 88     Resp 08/13/24 1905 16     Temp 08/13/24 1905 97.9 F (36.6 C)      Temp Source 08/13/24 1905 Oral     SpO2 08/13/24 1905 96 %     Weight 08/13/24 1903 140 lb (63.5 kg)     Height 08/13/24 1903 5' 4 (1.626 m)     Head Circumference --      Peak Flow --      Pain Score 08/13/24 1859 5     Pain Loc --      Pain Education --      Exclude from Growth Chart --    No data found.  Updated Vital Signs BP 106/72 (BP Location: Left Arm)   Pulse 88   Temp 97.9 F (36.6 C) (Oral)   Resp 16   Ht 5' 4 (1.626 m)   Wt 140 lb (63.5 kg)   LMP 08/02/2024 (Exact Date)   SpO2 96%   BMI 24.03 kg/m   Visual Acuity Right Eye Distance:   Left Eye Distance:   Bilateral Distance:    Right Eye Near:   Left Eye Near:    Bilateral Near:     Physical Exam Vitals reviewed.  Constitutional:      General: She is awake. She is not in acute distress.    Appearance: Normal appearance. She is well-developed. She is not ill-appearing.     Comments: Very pleasant female appears stated age in no acute distress sitting comfortably in exam room  HENT:     Head: Normocephalic and atraumatic.  Cardiovascular:     Rate and Rhythm: Normal rate and regular rhythm.     Heart sounds: Normal heart sounds, S1 normal and S2 normal. No murmur heard. Pulmonary:     Effort: Pulmonary effort is normal.     Breath sounds: Normal breath sounds. No wheezing, rhonchi or rales.     Comments: Clear to auscultation bilaterally Abdominal:     General: Bowel sounds are normal.     Palpations: Abdomen is soft.     Tenderness: There is no abdominal tenderness. There is no right CVA tenderness, left CVA tenderness, guarding or rebound.     Comments: Benign abdominal exam  Psychiatric:        Behavior: Behavior is cooperative.      UC Treatments / Results  Labs (all labs ordered are listed, but only abnormal results are displayed) Labs Reviewed  POCT URINE DIPSTICK - Abnormal; Notable for the following components:      Result Value   Color, UA orange (*)    Clarity,  UA cloudy (*)     Glucose, UA =100 (*)    Spec Grav, UA >=1.030 (*)    Blood, UA large (*)    POC PROTEIN,UA >=300 (*)    Nitrite, UA Positive (*)    Leukocytes, UA Small (1+) (*)    All other components within normal limits  GLUCOSE, POCT (MANUAL RESULT ENTRY) - Normal  URINE CULTURE    EKG   Radiology No results found.  Procedures Procedures (including critical care time)  Medications Ordered in UC Medications - No data to display  Initial Impression / Assessment and Plan / UC Course  I have reviewed the triage vital signs and the nursing notes.  Pertinent labs & imaging results that were available during my care of the patient were reviewed by me and considered in my medical decision making (see chart for details).     Patient is well-appearing, afebrile, nontoxic, nontachycardic.  Vital signs and physical exam are reassuring with no indication for emergent evaluation or imaging.   UA shows evidence of UTI but patient is taking Azo and so color interference is likely impacting results.  She does have glucosuria which I again think is related to chronic interference from the Azo but random glucose was obtained and was appropriate at 94.  She was started on Macrobid  twice daily for 5 days.  Will send this for culture and contact her if we need to discontinue or change her antibiotics based on culture results.  Recommend that she rest and drink plenty of fluid.  Discussed that if anything changes or worsens and she has a severe abdominal pain, fever, nausea, vomiting, weakness, persistent or worsening urinary symptoms she needs to be seen immediately.  Strict return precautions given.  Excuse note provided.   Final Clinical Impressions(s) / UC Diagnoses   Final diagnoses:  Acute cystitis with hematuria     Discharge Instructions      Your urine is concerning for urinary tract infection but as we discussed this can be hard to distinguish because you took the Azo.  Your blood sugar was  normal.  Start Macrobid  twice a day for 5 days.  We are sending your urine for culture and we will contact you if this changes our treatment plan.  If you develop any pelvic pain, abdominal pain, fever, nausea, vomiting you need to be seen immediately.    ED Prescriptions     Medication Sig Dispense Auth. Provider   nitrofurantoin , macrocrystal-monohydrate, (MACROBID ) 100 MG capsule  (Status: Discontinued) Take 1 capsule (100 mg total) by mouth 2 (two) times daily. 10 capsule Padraic Marinos K, PA-C   nitrofurantoin , macrocrystal-monohydrate, (MACROBID ) 100 MG capsule Take 1 capsule (100 mg total) by mouth 2 (two) times daily. 10 capsule Jackye Dever K, PA-C      PDMP not reviewed this encounter.   Sherrell Rocky POUR, PA-C 08/13/24 1942

## 2024-08-13 NOTE — ED Triage Notes (Signed)
 Patient reports having blood in her urine. She first noticed this today. Some dysuria with frequency now as well. No abd pain/nausea or vomiting. No concern for STI.

## 2024-08-13 NOTE — Discharge Instructions (Signed)
 Your urine is concerning for urinary tract infection but as we discussed this can be hard to distinguish because you took the Azo.  Your blood sugar was normal.  Start Macrobid  twice a day for 5 days.  We are sending your urine for culture and we will contact you if this changes our treatment plan.  If you develop any pelvic pain, abdominal pain, fever, nausea, vomiting you need to be seen immediately.

## 2024-08-15 LAB — URINE CULTURE: Culture: >=100000 — AB

## 2024-08-16 ENCOUNTER — Ambulatory Visit (HOSPITAL_COMMUNITY): Payer: Self-pay

## 2024-08-20 ENCOUNTER — Ambulatory Visit

## 2024-08-27 ENCOUNTER — Ambulatory Visit: Admitting: Internal Medicine

## 2024-08-27 ENCOUNTER — Other Ambulatory Visit: Payer: Self-pay

## 2024-08-27 VITALS — BP 126/78 | HR 90 | Temp 98.1°F | Ht 64.5 in | Wt 202.2 lb

## 2024-08-27 DIAGNOSIS — L2084 Intrinsic (allergic) eczema: Secondary | ICD-10-CM

## 2024-08-27 MED ORDER — TRIAMCINOLONE ACETONIDE 0.1 % EX OINT: TOPICAL_OINTMENT | CUTANEOUS | 1 refills | Status: AC

## 2024-08-27 MED ORDER — TACROLIMUS 0.1 % EX OINT: TOPICAL_OINTMENT | CUTANEOUS | 5 refills | Status: AC

## 2024-08-27 MED ORDER — HYDROCORTISONE 2.5 % EX CREA: TOPICAL_CREAM | CUTANEOUS | 5 refills | Status: AC

## 2024-08-27 NOTE — Progress Notes (Signed)
 "  FOLLOW UP Date of Service/Encounter:  08/27/2024   Subjective:  Cassie Garrett (DOB: [birth year]) is a 28 y.o. female who [facility] for follow up for eczema.   History obtained from: chart review and patient. Last seen by Dr Luke on 11/28/2023 with partial improvement of eczema on Dupixent  but had restarted it recently prior to the visit; discussed continuation at the time and if no improvement, consider switching.  Since last visit, she was doing okay but more recently with the colder weather, has had severe flare ups involving her arms, legs, stomach, back.  It is itchy and very dry.  She reports doing so much better on Dupixent  and hopes to restart.  She notes she was even able to wear a bikini and did not feel the need to hide her skin while on Dupixent .  She moisturizes regularly but her skin soaks it up.  Using triamcinolone /hydrocortisone  but not sure if its helping.  Was on protopic  in the past but not recently.     Past Medical History: Past Medical History:  Diagnosis Date   Eczema    Iron  deficiency     Objective:  BP 126/78 (BP Location: Right Arm, Patient Position: Sitting, Cuff Size: Normal)   Pulse 90   Temp 98.1 F (36.7 C) (Temporal)   Ht 5' 4.5 (1.638 m)   Wt 202 lb 3.2 oz (91.7 kg)   LMP 08/02/2024 (Exact Date)   SpO2 98%   BMI 34.17 kg/m  Body mass index is 34.17 kg/m. Physical Exam: GEN: alert, well developed HEENT: clear conjunctiva, nose without rhinorrhea or inferior turbinate hypertrophy  HEART: regular rate and rhythm, no murmur LUNGS: clear to auscultation bilaterally, no coughing, unlabored respiration SKIN: diffuse eczematous patches and dry skin on bl arms, legs, lower abdomen and back   Assessment:   1. Intrinsic atopic dermatitis     Plan/Recommendations:   Eczema: - Uncontrolled with diffuse involvement and unresponsive to topical steroids.  Restart Dupixent  as she had done well on it.   Informed her to keep track of her symptoms once Dupixent  restarts and if still remains uncontrolled, can consider switch to Ebglyss.  - Do a daily soaking tub bath in warm water for 10-15 minutes.  - Use a gentle, unscented cleanser at the end of the bath (such as Dove unscented bar or baby wash, or Aveeno sensitive body wash). Then rinse, pat half-way dry, and apply a gentle, unscented moisturizer cream or ointment (Cerave, Cetaphil, Eucerin, Aveeno, Aquaphor, Vanicream, Vaseline)  all over while still damp. Dry skin makes the itching and rash of eczema worse. The skin should be moisturized with a gentle, unscented moisturizer at least twice daily.  - Use only unscented liquid laundry detergent. - Apply prescribed topical steroid (triamcinolone  0.1% below neck or hydrocortisone  2.5% above neck) to flared areas (red and thickened eczema) after the moisturizer has soaked into the skin (wait at least 30 minutes). Taper off the topical steroids as the skin improves. Do not use topical steroid for more than 7-10 days at a time.  - Put Protopic /Tacrolimus  0.1% onto areas of rough eczema twice a day. May decrease to once a day as the eczema improves. This will not thin the skin, and is safe for chronic use. Do not put this onto normal appearing skin. - Restart Dupxient 300mg  every 2 weeks.  Will message Tammy.    Return in about 3 months (around 11/25/2024).  Arleta Blanch, MD  [facility]       "

## 2024-08-27 NOTE — Patient Instructions (Addendum)
 Eczema: - Do a daily soaking tub bath in warm water for 10-15 minutes.  - Use a gentle, unscented cleanser at the end of the bath (such as Dove unscented bar or baby wash, or Aveeno sensitive body wash). Then rinse, pat half-way dry, and apply a gentle, unscented moisturizer cream or ointment (Cerave, Cetaphil, Eucerin, Aveeno, Aquaphor, Vanicream, Vaseline)  all over while still damp. Dry skin makes the itching and rash of eczema worse. The skin should be moisturized with a gentle, unscented moisturizer at least twice daily.  - Use only unscented liquid laundry detergent. - Apply prescribed topical steroid (triamcinolone  0.1% below neck or hydrocortisone  2.5% above neck) to flared areas (red and thickened eczema) after the moisturizer has soaked into the skin (wait at least 30 minutes). Taper off the topical steroids as the skin improves. Do not use topical steroid for more than 7-10 days at a time.  - Put Protopic /Tacrolimus  0.1% onto areas of rough eczema twice a day. May decrease to once a day as the eczema improves. This will not thin the skin, and is safe for chronic use. Do not put this onto normal appearing skin. - Restart Dupxient 300mg  every 2 weeks.  Will message Tammy.

## 2024-08-31 ENCOUNTER — Telehealth: Payer: Self-pay | Admitting: *Deleted

## 2024-08-31 MED ORDER — DUPIXENT 300 MG/2ML ~~LOC~~ SOSY
300.0000 mg | PREFILLED_SYRINGE | SUBCUTANEOUS | 11 refills | Status: AC
  Filled 2024-09-20: qty 4, 28d supply, fill #0

## 2024-08-31 MED ORDER — DUPIXENT 300 MG/2ML ~~LOC~~ SOSY
600.0000 mg | PREFILLED_SYRINGE | Freq: Once | SUBCUTANEOUS | 0 refills | Status: AC
  Filled 2024-09-16: qty 4, 1d supply, fill #0

## 2024-08-31 NOTE — Telephone Encounter (Signed)
-----   Message from Arleta Blanch, MD sent at 08/27/2024 10:49 AM EST ----- Hey Cassie Garrett Brownie's eczema is flared up with pretty diffuse involvement.  Would like for her to restart Dupixent  because she was doing well on it.  She had missed appointments due to work and school so I would like her to transition to at home administration.

## 2024-08-31 NOTE — Telephone Encounter (Signed)
 Called patient and advised approval to restart dupixent  and will reach out once delivery set to make appt to get loading dose in clinic per patient request

## 2024-09-06 ENCOUNTER — Telehealth: Payer: Self-pay

## 2024-09-06 ENCOUNTER — Other Ambulatory Visit (HOSPITAL_COMMUNITY): Payer: Self-pay

## 2024-09-06 NOTE — Telephone Encounter (Signed)
 Dupixent  loading dose showing as not covered at this time, please advise. Thank you!

## 2024-09-08 ENCOUNTER — Encounter: Payer: Self-pay | Admitting: Emergency Medicine

## 2024-09-08 ENCOUNTER — Ambulatory Visit: Admission: EM | Admit: 2024-09-08 | Discharge: 2024-09-08 | Disposition: A | Discharge status: Home / Self Care

## 2024-09-08 DIAGNOSIS — J069 Acute upper respiratory infection, unspecified: Secondary | ICD-10-CM | POA: Diagnosis not present

## 2024-09-08 LAB — POCT INFLUENZA A/B
Influenza A, POC: NEGATIVE
Influenza B, POC: NEGATIVE

## 2024-09-08 MED ORDER — PSEUDOEPHEDRINE HCL 30 MG PO TABS: 30.0000 mg | ORAL_TABLET | ORAL | 0 refills | Status: AC | PRN

## 2024-09-08 MED ORDER — BENZONATATE 100 MG PO CAPS: 100.0000 mg | ORAL_CAPSULE | Freq: Three times a day (TID) | ORAL | 0 refills | Status: AC

## 2024-09-08 NOTE — ED Triage Notes (Signed)
 Pt presents c/o URI x 3 days. Pt states,  2 days ago I was having body aches and chills. My throat is sore and my voice is leaving. I keep getting headaches. I also have a cough and a runny nose and my ears feel muffled.  Pt denies emesis and diarrhea.

## 2024-09-08 NOTE — ED Provider Notes (Signed)
 " [facility]    CSN: [id number] Arrival date & time: 09/08/24  1254      History   Chief Complaint Chief Complaint  Patient presents with   Influenza    HPI Cassie Garrett is a 28 y.o. female.   Pt presents today due to chills, body aches, throat pain, headache, nasal congestion, and cough. Pt denies fever, loss of appetite, or known sick contacts. Pt states that she has been using OTC cold and flu meds with no significant relief.   The history is provided by the patient.  Influenza   Past Medical History:  Diagnosis Date   Eczema    Iron  deficiency     Patient Active Problem List   Diagnosis Date Noted   Allergic rhinitis 08/14/2023   Cyst of ovary 07/17/2023   Pain in pelvis 07/17/2023   Urinary tract infectious disease 07/17/2023   Iron  deficiency anemia 06/25/2023   Intrinsic atopic dermatitis 10/14/2022   Recurrent urinary tract infection 03/15/2021   Anemia 05/14/2019    History reviewed. No pertinent surgical history.  OB History     Gravida  1   Para      Term      Preterm      AB      Living         SAB      IAB      Ectopic      Multiple      Live Births               Home Medications    Prior to Admission medications  Medication Sig Start Date End Date Taking? Authorizing Provider  dupilumab  (DUPIXENT ) 300 MG/2ML prefilled syringe Inject 300 mg into the skin every 14 (fourteen) days. 08/31/24   Jeneal Danita Macintosh, MD  fluconazole  (DIFLUCAN ) 150 MG tablet Take 150 mg by mouth once. Patient not taking: Reported on 08/27/2024 03/25/24   [provider]  hydrocortisone  2.5 % cream Apply twice daily for flare ups above neck, maximum 7 days. 08/27/24   Tobie Arleta SQUIBB, MD  metroNIDAZOLE  (METROGEL ) 0.75 % vaginal gel Place 1 Applicatorful vaginally at bedtime. Patient not taking: Reported on 08/27/2024 01/26/24   [provider]  nitrofurantoin , macrocrystal-monohydrate, (MACROBID ) 100  MG capsule Take 1 capsule (100 mg total) by mouth 2 (two) times daily. 08/13/24   Raspet, Erin K, PA-C  phenazopyridine (PYRIDIUM) 95 MG tablet Take 95 mg by mouth 3 (three) times daily as needed for pain. Patient not taking: Reported on 08/27/2024    [provider]  tacrolimus  (PROTOPIC ) 0.1 % ointment Apply twice daily to rough thickened eczema. 08/27/24   Tobie Arleta SQUIBB, MD  triamcinolone  ointment (KENALOG ) 0.1 % Apply twice daily for flare ups below neck, maximum 10 days. 08/27/24   Tobie Arleta SQUIBB, MD    Family History History reviewed. No pertinent family history.  Social History Social History[1]   Allergies   Patient has no known allergies.   Review of Systems Review of Systems   Physical Exam Triage Vital Signs ED Triage Vitals  Encounter Vitals Group     BP 09/08/24 1346 129/83     Girls Systolic BP Percentile --      Girls Diastolic BP Percentile --      Boys Systolic BP Percentile --      Boys Diastolic BP Percentile --      Pulse Rate 09/08/24 1346 95     Resp 09/08/24 1346  18     Temp 09/08/24 1346 98.3 F (36.8 C)     Temp Source 09/08/24 1346 Oral     SpO2 09/08/24 1346 97 %     Weight 09/08/24 1345 202 lb 2.6 oz (91.7 kg)     Height --      Head Circumference --      Peak Flow --      Pain Score 09/08/24 1344 4     Pain Loc --      Pain Education --      Exclude from Growth Chart --    No data found.  Updated Vital Signs BP 129/83 (BP Location: Left Arm)   Pulse 95   Temp 98.3 F (36.8 C) (Oral)   Resp 18   Wt 202 lb 2.6 oz (91.7 kg)   LMP 09/05/2024   SpO2 97%   BMI 34.17 kg/m   Visual Acuity Right Eye Distance:   Left Eye Distance:   Bilateral Distance:    Right Eye Near:   Left Eye Near:    Bilateral Near:     Physical Exam Vitals and nursing note reviewed.  Constitutional:      General: She is not in acute distress.    Appearance: Normal appearance. She is not ill-appearing, toxic-appearing or diaphoretic.  HENT:      Nose: Congestion (markedly enlarged turbinates) present. No rhinorrhea.     Mouth/Throat:     Mouth: Mucous membranes are moist.     Pharynx: Oropharynx is clear. No oropharyngeal exudate or posterior oropharyngeal erythema.  Eyes:     General: No scleral icterus. Cardiovascular:     Rate and Rhythm: Normal rate and regular rhythm.     Heart sounds: Normal heart sounds.  Pulmonary:     Effort: Pulmonary effort is normal. No respiratory distress.     Breath sounds: Normal breath sounds. No wheezing or rhonchi.  Skin:    General: Skin is warm.  Neurological:     Mental Status: She is alert and oriented to person, place, and time.  Psychiatric:        Mood and Affect: Mood normal.        Behavior: Behavior normal.      UC Treatments / Results  Labs (all labs ordered are listed, but only abnormal results are displayed) Labs Reviewed  POCT INFLUENZA A/B    EKG   Radiology No results found.  Procedures Procedures (including critical care time)  Medications Ordered in UC Medications - No data to display  Initial Impression / Assessment and Plan / UC Course  I have reviewed the triage vital signs and the nursing notes.  Pertinent labs & imaging results that were available during my care of the patient were reviewed by me and considered in my medical decision making (see chart for details).      Final Clinical Impressions(s) / UC Diagnoses   Final diagnoses:  None   Discharge Instructions   None    ED Prescriptions   None    PDMP not reviewed this encounter.    [1]  Social History Tobacco Use   Smoking status: Some Days    Types: Cigarettes    Passive exposure: Past   Smokeless tobacco: Never   Tobacco comments:    Hooka  Vaping Use   Vaping status: Never Used  Substance Use Topics   Alcohol use: No    Alcohol/week: 0.0 standard drinks of alcohol   Drug use: No  Andra Corean BROCKS, PA-C 09/08/24 1411  "

## 2024-09-08 NOTE — Discharge Instructions (Signed)

## 2024-09-14 ENCOUNTER — Ambulatory Visit (INDEPENDENT_AMBULATORY_CARE_PROVIDER_SITE_OTHER): Admitting: *Deleted

## 2024-09-14 DIAGNOSIS — L209 Atopic dermatitis, unspecified: Secondary | ICD-10-CM | POA: Diagnosis not present

## 2024-09-14 MED ORDER — DUPILUMAB 300 MG/2ML ~~LOC~~ SOSY
600.0000 mg | PREFILLED_SYRINGE | Freq: Once | SUBCUTANEOUS | Status: AC
  Administered 2024-09-14: 600 mg via SUBCUTANEOUS

## 2024-09-14 NOTE — Progress Notes (Signed)
"                                                                        Immunotherapy   Patient Details  Name: Mykayla Brinton MRN: [id number] Date of Birth: 31-Jan-1996  09/14/2024  Olam Sharene Theys started injections for  Dupixent   Frequency: Every 2 weeks Epi-Pen: Not Required Consent signed and patient instructions given. Patient restarted Dupixent  today and received 600mg  loading dose sample. 300mg  in the RUA and 300mg  in the LUA. Patient waited 15 minutes in office and did not experience any issues.   Toluwanimi Radebaugh Fernandez-Vernon 09/14/2024, 4:16 PM   "

## 2024-09-16 ENCOUNTER — Other Ambulatory Visit: Payer: Self-pay

## 2024-09-16 ENCOUNTER — Other Ambulatory Visit (HOSPITAL_COMMUNITY): Payer: Self-pay

## 2024-09-20 ENCOUNTER — Other Ambulatory Visit: Payer: Self-pay

## 2024-09-20 ENCOUNTER — Other Ambulatory Visit (HOSPITAL_COMMUNITY): Payer: Self-pay

## 2024-09-20 NOTE — Progress Notes (Signed)
 Specialty Pharmacy Initiation Note   Cassie Garrett is a 29 y.o. female who will be followed by the specialty pharmacy service for RxSp Atopic Dermatitis    Review of administration, indication, effectiveness, safety, potential side effects, storage/disposable, and missed dose instructions occurred today for patient's specialty medication(s) Dupilumab  (Dupixent )     Patient/Caregiver did not have any additional questions or concerns.   Patient's therapy is appropriate to: Initiate    Goals Addressed             This Visit's Progress    Reduce signs and symptoms       Patient is re-initiating therapy. Patient will maintain adherence. Patient therapy being initiated again in provider office due to flare after discontinuing previously.          Sherman Lipuma M Zerrick Hanssen Specialty Pharmacist

## 2024-09-20 NOTE — Progress Notes (Signed)
 Specialty Pharmacy Initial Fill Coordination Note  Cassie Garrett is a 29 y.o. female contacted today regarding initial fill of specialty medication(s) Dupilumab  (Dupixent )   Patient requested Courier to Provider Office   Delivery date: 09/23/24   Verified address: [address] [place] [address]   Medication will be filled on: 09/22/24   Patient is aware of $4 copayment.

## 2024-09-22 ENCOUNTER — Other Ambulatory Visit: Payer: Self-pay

## 2024-09-28 ENCOUNTER — Ambulatory Visit

## 2024-09-28 DIAGNOSIS — L209 Atopic dermatitis, unspecified: Secondary | ICD-10-CM | POA: Diagnosis not present

## 2024-10-12 ENCOUNTER — Ambulatory Visit

## 2024-10-12 DIAGNOSIS — L209 Atopic dermatitis, unspecified: Secondary | ICD-10-CM

## 2024-10-26 ENCOUNTER — Ambulatory Visit

## 2024-12-02 ENCOUNTER — Ambulatory Visit: Admitting: Allergy
